# Patient Record
Sex: Male | Born: 1995 | Race: White | Hispanic: No | Marital: Single | Smoking: Current every day smoker
Health system: Southern US, Community
[De-identification: ages and names within clinical notes are randomized; demographics above are authoritative.]

## PROBLEM LIST (undated history)

## (undated) HISTORY — PX: OTHER SURGICAL HISTORY: SHX169

## (undated) HISTORY — PX: MYRINGOTOMY: SUR874

---
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
## 2015-06-25 ENCOUNTER — Emergency Department (HOSPITAL_COMMUNITY)
Admission: EM | Admit: 2015-06-25 | Discharge: 2015-06-25 | Disposition: A | Discharge status: Home / Self Care | Payer: Medicaid Other | Attending: Emergency Medicine | Admitting: Emergency Medicine

## 2015-06-25 ENCOUNTER — Emergency Department (HOSPITAL_COMMUNITY): Payer: Medicaid Other

## 2015-06-25 ENCOUNTER — Encounter (HOSPITAL_COMMUNITY): Payer: Self-pay | Admitting: Emergency Medicine

## 2015-06-25 DIAGNOSIS — Y939 Activity, unspecified: Secondary | ICD-10-CM | POA: Diagnosis not present

## 2015-06-25 DIAGNOSIS — F17210 Nicotine dependence, cigarettes, uncomplicated: Secondary | ICD-10-CM | POA: Diagnosis not present

## 2015-06-25 DIAGNOSIS — W108XXA Fall (on) (from) other stairs and steps, initial encounter: Secondary | ICD-10-CM | POA: Diagnosis not present

## 2015-06-25 DIAGNOSIS — M79641 Pain in right hand: Secondary | ICD-10-CM | POA: Diagnosis not present

## 2015-06-25 DIAGNOSIS — Y999 Unspecified external cause status: Secondary | ICD-10-CM | POA: Diagnosis not present

## 2015-06-25 DIAGNOSIS — Y929 Unspecified place or not applicable: Secondary | ICD-10-CM | POA: Diagnosis not present

## 2015-06-25 MED ORDER — IBUPROFEN 600 MG PO TABS: 600.0000 mg | ORAL_TABLET | Freq: Four times a day (QID) | ORAL | Status: DC | PRN

## 2015-06-25 NOTE — ED Provider Notes (Signed)
CSN: [id number]     Arrival date & time 06/25/15  1837 History  By signing my name below, I, Sean Hanson, attest that this documentation has been prepared under the direction and in the presence of [employer], PA-C. Electronically Signed: Evon Hanson, ED Scribe. 06/25/2015. 7:45 PM.      Chief Complaint  Patient presents with  . Hand Pain   The history is provided by the patient and medical records. No language interpreter was used.   HPI Comments: Sean Hanson is a 20 y.o. male who presents to the Emergency Department complaining of right hand pain onset 4 weeks prior. Pt doesn't report any associated symptoms. Pt states that his dog pulled him off his deck causing him to land on his right hand. He states that certain movements make the pain worse. Pt states that he has been taking ibuprofen with no relief. Pt denies numbness or tingling. Pt states that he is right hand dominant.  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    History reviewed. No pertinent past medical history. Past Surgical History  Procedure Laterality Date  . Myringotomy    . Tubes in ears     History reviewed. No pertinent family history. Social History  Substance Use Topics  . Smoking status: Current Every Day Smoker -- 0.25 packs/day    Types: Cigarettes  . Smokeless tobacco: None  . Alcohol Use: Yes     Comment: occ    Review of Systems  Constitutional: Negative for fever and chills.  Gastrointestinal: Negative for nausea and vomiting.  Musculoskeletal: Positive for joint swelling and arthralgias. Negative for back pain, neck pain and neck stiffness.  Skin: Negative for wound.  Neurological: Positive for weakness ( right hand grip strength). Negative for numbness.  Hematological: Does not bruise/bleed easily.  Psychiatric/Behavioral: The patient is not nervous/anxious.   All other systems reviewed and are negative.    Allergies  Review  of patient's allergies indicates no known allergies.  Home Medications   Prior to Admission medications   Medication Sig Start Date End Date Taking? Authorizing Provider  ibuprofen (ADVIL,MOTRIN) 600 MG tablet Take 1 tablet (600 mg total) by mouth every 6 (six) hours as needed for mild pain. 06/25/15   Kamilya Wakeman, PA-C   BP 155/93 mmHg  Pulse 70  Temp(Src) 98.9 F (37.2 C) (Oral)  Resp 18  Ht 6' (1.829 m)  Wt 61.236 kg  BMI 18.31 kg/m2  SpO2 100%   Physical Exam  Constitutional: He appears well-developed and well-nourished. No distress.  HENT:  Head: Normocephalic and atraumatic.  Eyes: Conjunctivae are normal.  Neck: Normal range of motion.  Cardiovascular: Normal rate, regular rhythm and intact distal pulses.   Capillary refill < 3 sec  Pulmonary/Chest: Effort normal and breath sounds normal.  Musculoskeletal: He exhibits tenderness. He exhibits no edema.  Right hand: FROM of all fingers, no swelling or ecchymosis to the hand; no TTP of the fingers or metacarpals; no deformity; sensation intact to dull and sharp in the entire hand; 5/5 grip strength and flexion/extension of the fingers  FROM of right wrist and right elbow with 5/5 strength in both with flexion and extension  Neurological: He is alert. Coordination normal.  Skin: Skin is warm and dry. He is not diaphoretic.  No tenting of the skin  Psychiatric: He has a normal mood and affect.  Nursing note and vitals reviewed.   ED Course  Procedures (including critical care  time) DIAGNOSTIC STUDIES: Oxygen Saturation is 100% on RA, normal by my interpretation.    COORDINATION OF CARE: 7:44 PM-Discussed treatment plan with pt at bedside and pt agreed to plan.     Imaging Review Dg Hand Complete Right  06/25/2015  CLINICAL DATA:  20 year old male with right hand pain. EXAM: RIGHT HAND - COMPLETE 3+ VIEW COMPARISON:  Radiographs dated 07/03/2011 FINDINGS: There is no evidence of fracture or dislocation. There is  no evidence of arthropathy or other focal bone abnormality. Soft tissues are unremarkable. IMPRESSION: Negative. Electronically Signed   By: Elgie CollardArash  Radparvar M.D.   On: 06/25/2015 19:18   I have personally reviewed and evaluated these images as part of my medical decision-making.    MDM   Final diagnoses:  Right hand pain    Marijo ConceptionBrandon A Hanson presents with 4 weeks of hand pain after a fall.  Patient X-Ray negative for obvious fracture or dislocation. Normal neurologic exam.  Pt c/o weakness, but has 5/5 strength in the hand. Pt advised to follow up with orthopedics if symptoms persist. Patient given brace while in ED, conservative therapy recommended and discussed. Patient will be dc home & is agreeable with above plan.  I personally performed the services described in this documentation, which was scribed in my presence. The recorded information has been reviewed and is accurate.   Dierdre ForthHannah Jamice Carreno, PA-C 06/25/15 2014  Marily MemosJason Mesner, MD 06/25/15 2253

## 2015-06-25 NOTE — Discharge Instructions (Signed)
1. Medications: alternate ibuprofen and tylenol for pain control, usual home medications 2. Treatment: rest, ice, elevate and use brace, drink plenty of fluids, gentle stretching 3. Follow Up: Please followup with orthopedics as directed or your PCP in 1 week if no improvement for discussion of your diagnoses and further evaluation after today's visit; if you do not have a primary care doctor use the resource guide provided to find one; Please return to the ER for worsening symptoms or other concerns    Musculoskeletal Pain Musculoskeletal pain is muscle and boney aches and pains. These pains can occur in any part of the body. Your caregiver may treat you without knowing the cause of the pain. They may treat you if blood or urine tests, X-rays, and other tests were normal.  CAUSES There is often not a definite cause or reason for these pains. These pains may be caused by a type of germ (virus). The discomfort may also come from overuse. Overuse includes working out too hard when your body is not fit. Boney aches also come from weather changes. Bone is sensitive to atmospheric pressure changes. HOME CARE INSTRUCTIONS   Ask when your test results will be ready. Make sure you get your test results.  Only take over-the-counter or prescription medicines for pain, discomfort, or fever as directed by your caregiver. If you were given medications for your condition, do not drive, operate machinery or power tools, or sign legal documents for 24 hours. Do not drink alcohol. Do not take sleeping pills or other medications that may interfere with treatment.  Continue all activities unless the activities cause more pain. When the pain lessens, slowly resume normal activities. Gradually increase the intensity and duration of the activities or exercise.  During periods of severe pain, bed rest may be helpful. Lay or sit in any position that is comfortable.  Putting ice on the injured area.  Put ice in a  bag.  Place a towel between your skin and the bag.  Leave the ice on for 15 to 20 minutes, 3 to 4 times a day.  Follow up with your caregiver for continued problems and no reason can be found for the pain. If the pain becomes worse or does not go away, it may be necessary to repeat tests or do additional testing. Your caregiver may need to look further for a possible cause. SEEK IMMEDIATE MEDICAL CARE IF:  You have pain that is getting worse and is not relieved by medications.  You develop chest pain that is associated with shortness or breath, sweating, feeling sick to your stomach (nauseous), or throw up (vomit).  Your pain becomes localized to the abdomen.  You develop any new symptoms that seem different or that concern you. MAKE SURE YOU:   Understand these instructions.  Will watch your condition.  Will get help right away if you are not doing well or get worse.   This information is not intended to replace advice given to you by your health care provider. Make sure you discuss any questions you have with your health care provider.   Document Released: 01/07/2005 Document Revised: 04/01/2011 Document Reviewed: 09/11/2012 Elsevier Interactive Patient Education [employer]2016 [employer]vier Inc.

## 2015-06-25 NOTE — ED Notes (Signed)
Pt states his dog pulled him off the steps about 4 weeks ago causing him to fall on his right hand.  States it continues to hurt.

## 2016-10-18 ENCOUNTER — Encounter (HOSPITAL_COMMUNITY): Payer: Self-pay | Admitting: *Deleted

## 2016-10-18 ENCOUNTER — Emergency Department (HOSPITAL_COMMUNITY)
Admission: EM | Admit: 2016-10-18 | Discharge: 2016-10-18 | Disposition: A | Discharge status: Home / Self Care | Payer: Self-pay | Attending: Emergency Medicine | Admitting: Emergency Medicine

## 2016-10-18 ENCOUNTER — Emergency Department (HOSPITAL_COMMUNITY): Payer: Self-pay

## 2016-10-18 DIAGNOSIS — F17210 Nicotine dependence, cigarettes, uncomplicated: Secondary | ICD-10-CM | POA: Insufficient documentation

## 2016-10-18 DIAGNOSIS — Y9389 Activity, other specified: Secondary | ICD-10-CM | POA: Insufficient documentation

## 2016-10-18 DIAGNOSIS — Y929 Unspecified place or not applicable: Secondary | ICD-10-CM | POA: Insufficient documentation

## 2016-10-18 DIAGNOSIS — R52 Pain, unspecified: Secondary | ICD-10-CM

## 2016-10-18 DIAGNOSIS — Y990 Civilian activity done for income or pay: Secondary | ICD-10-CM | POA: Insufficient documentation

## 2016-10-18 DIAGNOSIS — W228XXA Striking against or struck by other objects, initial encounter: Secondary | ICD-10-CM | POA: Insufficient documentation

## 2016-10-18 DIAGNOSIS — S43401A Unspecified sprain of right shoulder joint, initial encounter: Secondary | ICD-10-CM

## 2016-10-18 MED ORDER — CYCLOBENZAPRINE HCL 5 MG PO TABS: 5.0000 mg | ORAL_TABLET | Freq: Three times a day (TID) | ORAL | 0 refills | Status: DC | PRN

## 2016-10-18 MED ORDER — METHOCARBAMOL 500 MG PO TABS
500.0000 mg | ORAL_TABLET | Freq: Once | ORAL | Status: AC
  Administered 2016-10-18: 500 mg via ORAL
  Filled 2016-10-18: qty 1

## 2016-10-18 MED ORDER — IBUPROFEN 800 MG PO TABS: 800.0000 mg | ORAL_TABLET | Freq: Three times a day (TID) | ORAL | 0 refills | Status: DC

## 2016-10-18 MED ORDER — IBUPROFEN 800 MG PO TABS
800.0000 mg | ORAL_TABLET | Freq: Once | ORAL | Status: AC
  Administered 2016-10-18: 800 mg via ORAL
  Filled 2016-10-18: qty 1

## 2016-10-18 NOTE — Discharge Instructions (Signed)
Rest your shoulder as much as possible by using the sling.  Use the medicines prescribed for pain and suspected muscle spasm.  Use a heating pad 20 minutes 3 times daily. Call Dr Romeo Apple for further testing if your pain is not improving with this treatment.  Your xrays are negative for bony injuries.

## 2016-10-18 NOTE — ED Triage Notes (Signed)
Pt with right shoulder pain for 3 weeks, has progressively gotten worse.  Pt installs windows and about 3 weeks ago was carrying a "twin" window and hit door frame, window dropped and pt caught it.

## 2016-10-18 NOTE — ED Notes (Signed)
Pt states understanding of care given and follow up instructions.  Pt a.o ambulated from ED

## 2016-10-19 NOTE — ED Provider Notes (Signed)
[facility]-EMERGENCY DEPT Provider Note   CSN: [id number] Arrival date & time: 10/18/16  2156     History   Chief Complaint Chief Complaint  Patient presents with  . Shoulder Pain    HPI Sean Hanson is a 21 y.o. male.  The history is provided by the patient and the spouse.  Shoulder Pain   This is a new problem. Episode onset: 3 weeks ago. The problem occurs constantly. The problem has been gradually worsening. The pain is present in the right shoulder. The quality of the pain is described as aching and constant. The pain is at a severity of 6/10. The pain is moderate. Associated symptoms include limited range of motion. Pertinent negatives include no numbness. The symptoms are aggravated by activity. He has tried rest, heat and cold for the symptoms. The treatment provided no relief. There has been a history of trauma (He was directly struck by the edge of a window he was carrying (is a window installer) .).    History reviewed. No pertinent past medical history.  Patient Active Problem List   Diagnosis Date Noted  . FINGER SPRAIN 03/30/2009    Past Surgical History:  Procedure Laterality Date  . MYRINGOTOMY    . tubes in ears         Home Medications    Prior to Admission medications   Medication Sig Start Date End Date Taking? Authorizing Provider  cyclobenzaprine (FLEXERIL) 5 MG tablet Take 1 tablet (5 mg total) by mouth 3 (three) times daily as needed for muscle spasms. 10/18/16   Burgess Amor, PA-C  ibuprofen (ADVIL,MOTRIN) 800 MG tablet Take 1 tablet (800 mg total) by mouth 3 (three) times daily. 10/18/16   Burgess Amor, PA-C    Family History History reviewed. No pertinent family history.  Social History Social History  Substance Use Topics  . Smoking status: Current Every Day Smoker    Packs/day: 0.25    Types: Cigarettes  . Smokeless tobacco: Never Used  . Alcohol use Yes     Comment: occ     Allergies   Patient has no known allergies.   Review of  Systems Review of Systems  Constitutional: Negative for fever.  Musculoskeletal: Positive for arthralgias. Negative for joint swelling and myalgias.  Neurological: Negative for weakness and numbness.     Physical Exam Updated Vital Signs BP 122/71   Pulse 82   Temp 98.6 F (37 C) (Oral)   Resp 18   Ht 6' (1.829 m)   Wt 63.5 kg (140 lb)   SpO2 98%   BMI 18.99 kg/m   Physical Exam  Constitutional: He appears well-developed and well-nourished.  HENT:  Head: Atraumatic.  Neck: Normal range of motion.  Cardiovascular:  Pulses equal bilaterally  Musculoskeletal: He exhibits tenderness. He exhibits no edema or deformity.       Right shoulder: He exhibits decreased range of motion and spasm. He exhibits no effusion, no crepitus and no deformity.  ttp right upper posterior shoulder . No palpable deformity, no edema.    Neurological: He is alert. He has normal strength. He displays normal reflexes. No sensory deficit.  Equal grip strength.  Skin: Skin is warm and dry.  Psychiatric: He has a normal mood and affect.  Vitals reviewed.    ED Treatments / Results  Labs (all labs ordered are listed, but only abnormal results are displayed) Labs Reviewed - No data to display  EKG  EKG Interpretation None  Radiology Dg Shoulder Right  Result Date: 10/18/2016 CLINICAL DATA:  Right shoulder pain x3 weeks. EXAM: RIGHT SHOULDER - 2+ VIEW COMPARISON:  None. FINDINGS: There is no evidence of fracture or dislocation. There is no evidence of arthropathy or other focal bone abnormality. Soft tissues are unremarkable. IMPRESSION: No acute fracture or malalignment of the right shoulder. Intact AC joint. Electronically Signed   By: Tollie Eth M.D.   On: 10/18/2016 22:46    Procedures Procedures (including critical care time)  Medications Ordered in ED Medications  ibuprofen (ADVIL,MOTRIN) tablet 800 mg (800 mg Oral Given 10/18/16 2329)  methocarbamol (ROBAXIN) tablet 500 mg (500  mg Oral Given 10/18/16 2329)     Initial Impression / Assessment and Plan / ED Course  I have reviewed the triage vital signs and the nursing notes.  Pertinent labs & imaging results that were available during my care of the patient were reviewed by me and considered in my medical decision making (see chart for details).     Imaging negative, suspect right shoulder sprain, discussed need for rest, heat, ibuprofen.  Flexeril also prescribed for muscle spasm component.  Sling provided.  Referral to ortho for further eval if sx persist.  Final Clinical Impressions(s) / ED Diagnoses   Final diagnoses:  Pain  Sprain of right shoulder, unspecified shoulder sprain type, initial encounter    New Prescriptions Discharge Medication List as of 10/18/2016 11:24 PM    START taking these medications   Details  cyclobenzaprine (FLEXERIL) 5 MG tablet Take 1 tablet (5 mg total) by mouth 3 (three) times daily as needed for muscle spasms., Starting Fri 10/18/2016, Print         Burgess Amor, PA-C 10/19/16 1610    Blane Ohara, MD 10/20/16 [id number]

## 2016-12-19 ENCOUNTER — Emergency Department (HOSPITAL_COMMUNITY): Payer: Self-pay

## 2016-12-19 ENCOUNTER — Encounter (HOSPITAL_COMMUNITY): Payer: Self-pay | Admitting: Emergency Medicine

## 2016-12-19 ENCOUNTER — Emergency Department (HOSPITAL_COMMUNITY)
Admission: EM | Admit: 2016-12-19 | Discharge: 2016-12-19 | Disposition: A | Discharge status: Home / Self Care | Payer: Self-pay | Attending: Emergency Medicine | Admitting: Emergency Medicine

## 2016-12-19 DIAGNOSIS — J029 Acute pharyngitis, unspecified: Secondary | ICD-10-CM | POA: Insufficient documentation

## 2016-12-19 DIAGNOSIS — R69 Illness, unspecified: Secondary | ICD-10-CM

## 2016-12-19 DIAGNOSIS — R05 Cough: Secondary | ICD-10-CM | POA: Insufficient documentation

## 2016-12-19 DIAGNOSIS — F17210 Nicotine dependence, cigarettes, uncomplicated: Secondary | ICD-10-CM | POA: Insufficient documentation

## 2016-12-19 DIAGNOSIS — R51 Headache: Secondary | ICD-10-CM | POA: Insufficient documentation

## 2016-12-19 DIAGNOSIS — J111 Influenza due to unidentified influenza virus with other respiratory manifestations: Secondary | ICD-10-CM

## 2016-12-19 LAB — RAPID STREP SCREEN (MED CTR MEBANE ONLY): STREPTOCOCCUS, GROUP A SCREEN (DIRECT): NEGATIVE

## 2016-12-19 MED ORDER — IBUPROFEN 800 MG PO TABS: 800.0000 mg | ORAL_TABLET | Freq: Three times a day (TID) | ORAL | 0 refills | Status: DC

## 2016-12-19 MED ORDER — MAGIC MOUTHWASH W/LIDOCAINE: 5.0000 mL | Freq: Three times a day (TID) | ORAL | 0 refills | Status: AC | PRN

## 2016-12-19 MED ORDER — IBUPROFEN 800 MG PO TABS
800.0000 mg | ORAL_TABLET | Freq: Once | ORAL | Status: AC
  Administered 2016-12-19: 800 mg via ORAL
  Filled 2016-12-19: qty 1

## 2016-12-19 MED ORDER — GUAIFENESIN-CODEINE 100-10 MG/5ML PO SYRP: 10.0000 mL | ORAL_SOLUTION | Freq: Three times a day (TID) | ORAL | 0 refills | Status: AC | PRN

## 2016-12-19 MED ORDER — BENZONATATE 100 MG PO CAPS
200.0000 mg | ORAL_CAPSULE | Freq: Once | ORAL | Status: AC
  Administered 2016-12-19: 200 mg via ORAL
  Filled 2016-12-19: qty 2

## 2016-12-19 NOTE — ED Provider Notes (Signed)
[facility] EMERGENCY DEPARTMENT Provider Note   CSN: [id number] Arrival date & time: 12/19/16  [id number]     History   Chief Complaint Chief Complaint  Patient presents with  . Cough    HPI Sean Hanson is a 21 y.o. male.  HPI   Sean Hanson is a 21 y.o. male who presents to the Emergency Department complaining of productive cough, generalized body aches, sore throat, and frontal headache.  Symptoms began 3 days ago.  States the cough is intermittently been productive.  He is taking over-the-counter cough and cold medication without relief.  He describes the headache as a throbbing sensation to his forehead and bilateral temples that is worse with persistent cough.  He also reports recent sick contacts.  He denies neck pain or stiffness, shortness of breath, chest pain, known fever, abdominal pain, visual changes and dizziness.   History reviewed. No pertinent past medical history.  Patient Active Problem List   Diagnosis Date Noted  . FINGER SPRAIN 03/30/2009    Past Surgical History:  Procedure Laterality Date  . MYRINGOTOMY    . tubes in ears         Home Medications    Prior to Admission medications   Medication Sig Start Date End Date Taking? Authorizing Provider  cyclobenzaprine (FLEXERIL) 5 MG tablet Take 1 tablet (5 mg total) by mouth 3 (three) times daily as needed for muscle spasms. 10/18/16   Burgess AmorIdol, Julie, PA-C  ibuprofen (ADVIL,MOTRIN) 800 MG tablet Take 1 tablet (800 mg total) by mouth 3 (three) times daily. 10/18/16   Burgess AmorIdol, Julie, PA-C    Family History History reviewed. No pertinent family history.  Social History Social History   Tobacco Use  . Smoking status: Current Every Day Smoker    Packs/day: 1.00    Types: Cigarettes  . Smokeless tobacco: Never Used  Substance Use Topics  . Alcohol use: Yes    Comment: occ  . Drug use: No     Allergies   Patient has no known allergies.   Review of Systems Review of Systems    Constitutional: Positive for appetite change. Negative for chills and fever.  HENT: Positive for congestion and sore throat. Negative for trouble swallowing.   Respiratory: Positive for cough and chest tightness. Negative for shortness of breath and wheezing.   Cardiovascular: Negative for chest pain.  Gastrointestinal: Negative for abdominal pain, nausea and vomiting.  Genitourinary: Negative for dysuria.  Musculoskeletal: Positive for myalgias. Negative for arthralgias, neck pain and neck stiffness.  Skin: Negative for rash.  Neurological: Positive for headaches. Negative for dizziness, syncope, weakness and numbness.  Hematological: Negative for adenopathy.  All other systems reviewed and are negative.    Physical Exam Updated Vital Signs BP (!) 137/92 (BP Location: Left Arm)   Pulse (!) 109   Temp 99.3 F (37.4 C) (Oral)   Resp 16   Ht 6' (1.829 m)   Wt 65.8 kg (145 lb)   SpO2 99%   BMI 19.67 kg/m   Physical Exam  Constitutional: He is oriented to person, place, and time. He appears well-developed and well-nourished. No distress.  HENT:  Head: Normocephalic and atraumatic.  Right Ear: Tympanic membrane and ear canal normal.  Left Ear: Tympanic membrane and ear canal normal.  Nose: Mucosal edema and rhinorrhea present.  Mouth/Throat: Uvula is midline, oropharynx is clear and moist and mucous membranes are normal. No trismus in the jaw. No uvula swelling. No oropharyngeal exudate, posterior oropharyngeal edema or  tonsillar abscesses.  Mild erythema of the oropharynx.  1+ edema of the bilateral tonsils without exudate.  Uvula is midline and nonedematous  Eyes: Conjunctivae and EOM are normal. Pupils are equal, round, and reactive to light.  Neck: Normal range of motion, full passive range of motion without pain and phonation normal. Neck supple. No Kernig's sign noted.  Cardiovascular: Normal rate, regular rhythm, normal heart sounds and intact distal pulses.  No murmur  heard. Pulmonary/Chest: Effort normal and breath sounds normal. No stridor. No respiratory distress. He has no wheezes. He has no rales. He exhibits no tenderness.  Abdominal: Soft. He exhibits no distension. There is no tenderness. There is no rebound and no guarding.  Musculoskeletal: He exhibits no edema.  Lymphadenopathy:    He has no cervical adenopathy.  Neurological: He is alert and oriented to person, place, and time. No sensory deficit. He exhibits normal muscle tone. Coordination normal.  Skin: Skin is warm and dry. Capillary refill takes less than 2 seconds.  Nursing note and vitals reviewed.    ED Treatments / Results  Labs (all labs ordered are listed, but only abnormal results are displayed) Labs Reviewed  RAPID STREP SCREEN (NOT AT [facility])  CULTURE, GROUP A STREP [facility])    EKG  EKG Interpretation None       Radiology Dg Chest 2 View  Result Date: 12/19/2016 CLINICAL DATA:  21 year old male with history of cough and chest tightness since 12/16/2016. EXAM: CHEST  2 VIEW COMPARISON:  Chest x-ray 12/21/2014. FINDINGS: Lung volumes are normal. No consolidative airspace disease. No pleural effusions. No pneumothorax. No pulmonary nodule or mass noted. Pulmonary vasculature and the cardiomediastinal silhouette are within normal limits. IMPRESSION: 1.  No radiographic evidence of acute cardiopulmonary disease. Electronically Signed   By: Trudie Reedaniel  Entrikin M.D.   On: 12/19/2016 09:13    Procedures Procedures (including critical care time)  Medications Ordered in ED Medications  benzonatate (TESSALON) capsule 200 mg (200 mg Oral Given 12/19/16 0901)  ibuprofen (ADVIL,MOTRIN) tablet 800 mg (800 mg Oral Given 12/19/16 0901)     Initial Impression / Assessment and Plan / ED Course  I have reviewed the triage vital signs and the nursing notes.  Pertinent labs & imaging results that were available during my care of the patient were reviewed by me and considered in my  medical decision making (see chart for details).     Patient is feeling better after oral fluids.  Vital signs improved.  Symptoms are likely viral.  No meningeal signs or focal neurological deficits.  Headache improved after ibuprofen.  He appears stable for discharge, return precautions discussed.  Final Clinical Impressions(s) / ED Diagnoses   Final diagnoses:  Influenza-like illness    ED Discharge Orders    None       Pauline Ausriplett, Haillie Radu, PA-C 12/19/16 Johnna Acosta1938    Knapp, Jon, MD 12/21/16 [phone]

## 2016-12-19 NOTE — Discharge Instructions (Signed)
Drink plenty of fluids.  Tylenol every 4 hours as needed for fever.  Follow-up with your primary doctor or return to ER for any worsening symptoms.

## 2016-12-19 NOTE — ED Triage Notes (Signed)
Pt reports productive cough with sore throat and headache since Monday.

## 2016-12-21 LAB — CULTURE, GROUP A STREP (THRC)

## 2018-05-25 ENCOUNTER — Emergency Department (HOSPITAL_COMMUNITY): Payer: Self-pay

## 2018-05-25 ENCOUNTER — Emergency Department (HOSPITAL_COMMUNITY)
Admission: EM | Admit: 2018-05-25 | Discharge: 2018-05-25 | Disposition: A | Discharge status: Home / Self Care | Payer: Self-pay | Attending: Emergency Medicine | Admitting: Emergency Medicine

## 2018-05-25 ENCOUNTER — Other Ambulatory Visit: Payer: Self-pay

## 2018-05-25 ENCOUNTER — Encounter (HOSPITAL_COMMUNITY): Payer: Self-pay

## 2018-05-25 DIAGNOSIS — Y999 Unspecified external cause status: Secondary | ICD-10-CM | POA: Insufficient documentation

## 2018-05-25 DIAGNOSIS — F17210 Nicotine dependence, cigarettes, uncomplicated: Secondary | ICD-10-CM | POA: Insufficient documentation

## 2018-05-25 DIAGNOSIS — Y9389 Activity, other specified: Secondary | ICD-10-CM | POA: Insufficient documentation

## 2018-05-25 DIAGNOSIS — Z23 Encounter for immunization: Secondary | ICD-10-CM | POA: Insufficient documentation

## 2018-05-25 DIAGNOSIS — Y929 Unspecified place or not applicable: Secondary | ICD-10-CM | POA: Insufficient documentation

## 2018-05-25 DIAGNOSIS — S62357A Nondisplaced fracture of shaft of fifth metacarpal bone, left hand, initial encounter for closed fracture: Secondary | ICD-10-CM | POA: Insufficient documentation

## 2018-05-25 DIAGNOSIS — Z79899 Other long term (current) drug therapy: Secondary | ICD-10-CM | POA: Insufficient documentation

## 2018-05-25 DIAGNOSIS — W2209XA Striking against other stationary object, initial encounter: Secondary | ICD-10-CM | POA: Insufficient documentation

## 2018-05-25 MED ORDER — IBUPROFEN 600 MG PO TABS: 600.0000 mg | ORAL_TABLET | Freq: Four times a day (QID) | ORAL | 0 refills | Status: DC | PRN

## 2018-05-25 MED ORDER — BACITRACIN-NEOMYCIN-POLYMYXIN 400-5-5000 EX OINT
TOPICAL_OINTMENT | Freq: Once | CUTANEOUS | Status: AC
Start: 2018-05-25 — End: 2018-05-25
  Administered 2018-05-25: 18:00:00 via TOPICAL
  Filled 2018-05-25: qty 1

## 2018-05-25 MED ORDER — TETANUS-DIPHTH-ACELL PERTUSSIS 5-2.5-18.5 LF-MCG/0.5 IM SUSP
0.5000 mL | Freq: Once | INTRAMUSCULAR | Status: AC
  Administered 2018-05-25: 0.5 mL via INTRAMUSCULAR
  Filled 2018-05-25: qty 0.5

## 2018-05-25 NOTE — ED Triage Notes (Signed)
Pt reports punching wall yesterday. Left hand swollen and painful. Also noted to have abraised areas to right fingers and knuckles

## 2018-05-25 NOTE — Discharge Instructions (Addendum)
Keep your splint clean and dry.  Elevation and ice will continue to help with pain and swelling.  I recommend ibuprofen if needed for pain relief.
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
## 2018-07-26 ENCOUNTER — Ambulatory Visit (HOSPITAL_COMMUNITY)
Admission: RE | Admit: 2018-07-26 | Discharge: 2018-07-26 | Disposition: A | Discharge status: Home / Self Care | Payer: Self-pay | Attending: Psychiatry | Admitting: Psychiatry

## 2018-07-26 DIAGNOSIS — F411 Generalized anxiety disorder: Secondary | ICD-10-CM | POA: Insufficient documentation

## 2018-07-26 DIAGNOSIS — F331 Major depressive disorder, recurrent, moderate: Secondary | ICD-10-CM | POA: Insufficient documentation

## 2018-07-26 DIAGNOSIS — F17210 Nicotine dependence, cigarettes, uncomplicated: Secondary | ICD-10-CM | POA: Insufficient documentation
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
## 2018-07-26 NOTE — H&P (Signed)
Behavioral Health Medical Screening Exam  Sean Hanson is an 23 y.o. male that presents to [facility] unaccompanied.  He is AOx4 and engaging on approach.  He states that he has been feeling depressed and anxious. Pt states that he has never had a formal diagnosis but has identified with these feelings in the past.  He denies alcohol or drug use. Pt denies SI or HI.  States that he feels safe to go home and to call the crisis hotline or come back in if he started to have self injurious thoughts.  Pt was advised to follow up with outpatient care.  He was given resources and was discharged home.    Patient does not meet criteria for psychiatric inpatient care.  Total Time spent with patient: 30 minutes  Psychiatric Specialty Exam: Physical Exam  Constitutional: He is oriented to person, place, and time. He appears well-developed and well-nourished. He is cooperative.  HENT:  Head: Normocephalic.  Eyes: Pupils are equal, round, and reactive to light.  Respiratory: Effort normal.  Musculoskeletal: Normal range of motion.  Neurological: He is alert and oriented to person, place, and time.    Review of Systems  Psychiatric/Behavioral: Positive for depression. Negative for hallucinations, memory loss, substance abuse and suicidal ideas. The patient is nervous/anxious and has insomnia.   All other systems reviewed and are negative.   Blood pressure (!) 127/91, pulse 85, temperature 98.7 F (37.1 C), temperature source Oral, resp. rate 16.There is no height or weight on file to calculate BMI.  General Appearance: Casual  Eye Contact:  Good  Speech:  Normal Rate  Volume:  Normal  Mood:  Depressed  Affect:  Congruent  Thought Process:  Coherent  Orientation:  Full (Time, Place, and Person)  Thought Content:  WDL  Suicidal Thoughts:  No  Homicidal Thoughts:  No  Memory:  Immediate;   Good  Judgement:  Good  Insight:  Good  Psychomotor Activity:  Normal  Concentration: Concentration: Good   Recall:  Good  Fund of Knowledge:Good  Language: Good  Akathisia:  NA  Handed:  Right  AIMS (if indicated):     Assets:  Desire for Improvement  Sleep:       Musculoskeletal: Strength & Muscle Tone: within normal limits Gait & Station: normal Patient leans: N/A  Blood pressure (!) 127/91, pulse 85, temperature 98.7 F (37.1 C), temperature source Oral, resp. rate 16.  Recommendations:  Based on my evaluation the patient does not appear to have an emergency medical condition.  Deloria Lair, NP 07/26/2018, 8:59 PM

## 2018-11-10 ENCOUNTER — Ambulatory Visit: Payer: Self-pay | Admitting: General Surgery

## 2018-12-08 ENCOUNTER — Ambulatory Visit (INDEPENDENT_AMBULATORY_CARE_PROVIDER_SITE_OTHER): Payer: Self-pay | Admitting: General Surgery

## 2018-12-08 ENCOUNTER — Other Ambulatory Visit: Payer: Self-pay

## 2018-12-08 ENCOUNTER — Encounter: Payer: Self-pay | Admitting: General Surgery

## 2018-12-08 VITALS — BP 134/87 | HR 82 | Temp 98.2°F | Resp 16 | Ht 72.0 in | Wt 174.0 lb

## 2018-12-08 DIAGNOSIS — K4090 Unilateral inguinal hernia, without obstruction or gangrene, not specified as recurrent: Secondary | ICD-10-CM

## 2018-12-08 NOTE — Progress Notes (Signed)
Sean Hanson; [id number]; 04-15-1995   HPI Patient is a 23 year old white male who referred himself to my care for evaluation and treatment of a right inguinal hernia.  He states he has had a hernia present on the right side for many years.  Is recently becoming more uncomfortable and enlarging in size.  It is made worse with straining.  He currently has 0 out of 10 abdominal pain. History reviewed. No pertinent past medical history.  Past Surgical History:  Procedure Laterality Date  . MYRINGOTOMY    . tubes in ears      History reviewed. No pertinent family history.  Current Outpatient Medications on File Prior to Visit  Medication Sig Dispense Refill  . aspirin-acetaminophen-caffeine (EXCEDRIN MIGRAINE) 250-250-65 MG tablet Take 2 tablets by mouth every 6 (six) hours as needed for headache.    . Phenylephrine-DM-GG-APAP (TYLENOL COLD/FLU SEVERE) 5-10-200-325 MG TABS Take 2 tablets by mouth daily as needed (cold).    Marland Kitchen guaiFENesin-codeine (ROBITUSSIN AC) 100-10 MG/5ML syrup Take 10 mLs by mouth 3 (three) times daily as needed. (Patient not taking: Reported on 12/08/2018) 120 mL 0  . ibuprofen (ADVIL) 600 MG tablet Take 1 tablet (600 mg total) by mouth every 6 (six) hours as needed. (Patient not taking: Reported on 12/08/2018) 30 tablet 0  . magic mouthwash w/lidocaine SOLN Take 5 mLs by mouth 3 (three) times daily as needed for mouth pain. Swish and spit, do not swallow (Patient not taking: Reported on 12/08/2018) 100 mL 0   No current facility-administered medications on file prior to visit.     No Known Allergies  Social History   Substance and Sexual Activity  Alcohol Use Yes   Comment: occ    Social History   Tobacco Use  Smoking Status Current Every Day Smoker  . Packs/day: 1.00  . Types: Cigarettes  Smokeless Tobacco Never Used    Review of Systems  Constitutional: Negative.   HENT: Negative.   Eyes: Negative.   Respiratory: Negative.   Cardiovascular:  Negative.   Gastrointestinal: Positive for heartburn.  Genitourinary: Positive for frequency.  Musculoskeletal: Positive for back pain and joint pain.  Skin: Negative.   Neurological: Negative.   Endo/Heme/Allergies: Negative.   Psychiatric/Behavioral: Negative.     Objective   Vitals:   12/08/18 1540  BP: 134/87  Pulse: 82  Resp: 16  Temp: 98.2 F (36.8 C)  SpO2: 95%    Physical Exam Vitals signs reviewed.  Constitutional:      Appearance: Normal appearance. He is normal weight. He is not ill-appearing.  HENT:     Head: Normocephalic and atraumatic.  Cardiovascular:     Rate and Rhythm: Normal rate and regular rhythm.     Heart sounds: Normal heart sounds. No murmur. No friction rub. No gallop.   Pulmonary:     Effort: Pulmonary effort is normal. No respiratory distress.     Breath sounds: Normal breath sounds. No stridor. No wheezing, rhonchi or rales.  Abdominal:     General: Abdomen is flat. There is no distension.     Palpations: Abdomen is soft. There is no mass.     Tenderness: There is no abdominal tenderness. There is no guarding or rebound.     Hernia: A hernia is present.     Comments: Reducible right inguinal hernia with possible extension into the right testicle.  Skin:    General: Skin is warm and dry.  Neurological:     Mental Status: He is alert and  oriented to person, place, and time.     Assessment  Right inguinal hernia Plan   Patient is scheduled for right inguinal herniorrhaphy with mesh on 12/16/2018.  The risks and benefits of the procedure including bleeding, infection, mesh use, and the possibly recurrence of the hernia were fully explained to the patient, who gave informed consent.  

## 2018-12-08 NOTE — H&P (Signed)
Sean Hanson; [id number]; 04-15-1995   HPI Patient is a 23 year old white male who referred himself to my care for evaluation and treatment of a right inguinal hernia.  He states he has had a hernia present on the right side for many years.  Is recently becoming more uncomfortable and enlarging in size.  It is made worse with straining.  He currently has 0 out of 10 abdominal pain. History reviewed. No pertinent past medical history.  Past Surgical History:  Procedure Laterality Date  . MYRINGOTOMY    . tubes in ears      History reviewed. No pertinent family history.  Current Outpatient Medications on File Prior to Visit  Medication Sig Dispense Refill  . aspirin-acetaminophen-caffeine (EXCEDRIN MIGRAINE) 250-250-65 MG tablet Take 2 tablets by mouth every 6 (six) hours as needed for headache.    . Phenylephrine-DM-GG-APAP (TYLENOL COLD/FLU SEVERE) 5-10-200-325 MG TABS Take 2 tablets by mouth daily as needed (cold).    Marland Kitchen guaiFENesin-codeine (ROBITUSSIN AC) 100-10 MG/5ML syrup Take 10 mLs by mouth 3 (three) times daily as needed. (Patient not taking: Reported on 12/08/2018) 120 mL 0  . ibuprofen (ADVIL) 600 MG tablet Take 1 tablet (600 mg total) by mouth every 6 (six) hours as needed. (Patient not taking: Reported on 12/08/2018) 30 tablet 0  . magic mouthwash w/lidocaine SOLN Take 5 mLs by mouth 3 (three) times daily as needed for mouth pain. Swish and spit, do not swallow (Patient not taking: Reported on 12/08/2018) 100 mL 0   No current facility-administered medications on file prior to visit.     No Known Allergies  Social History   Substance and Sexual Activity  Alcohol Use Yes   Comment: occ    Social History   Tobacco Use  Smoking Status Current Every Day Smoker  . Packs/day: 1.00  . Types: Cigarettes  Smokeless Tobacco Never Used    Review of Systems  Constitutional: Negative.   HENT: Negative.   Eyes: Negative.   Respiratory: Negative.   Cardiovascular:  Negative.   Gastrointestinal: Positive for heartburn.  Genitourinary: Positive for frequency.  Musculoskeletal: Positive for back pain and joint pain.  Skin: Negative.   Neurological: Negative.   Endo/Heme/Allergies: Negative.   Psychiatric/Behavioral: Negative.     Objective   Vitals:   12/08/18 1540  BP: 134/87  Pulse: 82  Resp: 16  Temp: 98.2 F (36.8 C)  SpO2: 95%    Physical Exam Vitals signs reviewed.  Constitutional:      Appearance: Normal appearance. He is normal weight. He is not ill-appearing.  HENT:     Head: Normocephalic and atraumatic.  Cardiovascular:     Rate and Rhythm: Normal rate and regular rhythm.     Heart sounds: Normal heart sounds. No murmur. No friction rub. No gallop.   Pulmonary:     Effort: Pulmonary effort is normal. No respiratory distress.     Breath sounds: Normal breath sounds. No stridor. No wheezing, rhonchi or rales.  Abdominal:     General: Abdomen is flat. There is no distension.     Palpations: Abdomen is soft. There is no mass.     Tenderness: There is no abdominal tenderness. There is no guarding or rebound.     Hernia: A hernia is present.     Comments: Reducible right inguinal hernia with possible extension into the right testicle.  Skin:    General: Skin is warm and dry.  Neurological:     Mental Status: He is alert and  oriented to person, place, and time.     Assessment  Right inguinal hernia Plan   Patient is scheduled for right inguinal herniorrhaphy with mesh on 12/16/2018.  The risks and benefits of the procedure including bleeding, infection, mesh use, and the possibly recurrence of the hernia were fully explained to the patient, who gave informed consent.

## 2018-12-08 NOTE — Patient Instructions (Signed)
Inguinal Hernia, Adult °An inguinal hernia develops when fat or the intestines push through a weak spot in a muscle where your leg meets your lower abdomen (groin). This creates a bulge. This kind of hernia could also be: °· In your scrotum, if you are male. °· In folds of skin around your vagina, if you are male. °There are three types of inguinal hernias: °· Hernias that can be pushed back into the abdomen (are reducible). This type rarely causes pain. °· Hernias that are not reducible (are incarcerated). °· Hernias that are not reducible and lose their blood supply (are strangulated). This type of hernia requires emergency surgery. °What are the causes? °This condition is caused by having a weak spot in the muscles or tissues in the groin. This weak spot develops over time. The hernia may poke through the weak spot when you suddenly strain your lower abdominal muscles, such as when you: °· Lift a heavy object. °· Strain to have a bowel movement. Constipation can lead to straining. °· Cough. °What increases the risk? °This condition is more likely to develop in: °· Men. °· Pregnant women. °· People who: °? Are overweight. °? Work in jobs that require long periods of standing or heavy lifting. °? Have had an inguinal hernia before. °? Smoke or have lung disease. These factors can lead to long-lasting (chronic) coughing. °What are the signs or symptoms? °Symptoms may depend on the size of the hernia. Often, a small inguinal hernia has no symptoms. Symptoms of a larger hernia may include: °· A lump in the groin area. This is easier to see when standing. It might not be visible when lying down. °· Pain or burning in the groin. This may get worse when lifting, straining, or coughing. °· A dull ache or a feeling of pressure in the groin. °· In men, an unusual lump in the scrotum. °Symptoms of a strangulated inguinal hernia may include: °· A bulge in your groin that is very painful and tender to the touch. °· A bulge  that turns red or purple. °· Fever, nausea, and vomiting. °· Inability to have a bowel movement or to pass gas. °How is this diagnosed? °This condition is diagnosed based on your symptoms, your medical history, and a physical exam. Your health care provider may feel your groin area and ask you to cough. °How is this treated? °Treatment depends on the size of your hernia and whether you have symptoms. If you do not have symptoms, your health care provider may have you watch your hernia carefully and have you come in for follow-up visits. If your hernia is large or if you have symptoms, you may need surgery to repair the hernia. °Follow these instructions at home: °Lifestyle °· Avoid lifting heavy objects. °· Avoid standing for long periods of time. °· Do not use any products that contain nicotine or tobacco, such as cigarettes and e-cigarettes. If you need help quitting, ask your health care provider. °· Maintain a healthy weight. °Preventing constipation °· Take actions to prevent constipation. Constipation leads to straining with bowel movements, which can make a hernia worse or cause a hernia repair to break down. Your health care provider may recommend that you: °? Drink enough fluid to keep your urine pale yellow. °? Eat foods that are high in fiber, such as fresh fruits and vegetables, whole grains, and beans. °? Limit foods that are high in fat and processed sugars, such as fried or sweet foods. °? Take an over-the-counter   or prescription medicine for constipation. °General instructions °· You may try to push the hernia back in place by very gently pressing on it while lying down. Do not try to force the bulge back in if it will not push in easily. °· Watch your hernia for any changes in shape, size, or color. Get help right away if you notice any changes. °· Take over-the-counter and prescription medicines only as told by your health care provider. °· Keep all follow-up visits as told by your health care  provider. This is important. °Contact a health care provider if: °· You have a fever. °· You develop new symptoms. °· Your symptoms get worse. °Get help right away if: °· You have pain in your groin that suddenly gets worse. °· You have a bulge in your groin that: °? Suddenly gets bigger and does not get smaller. °? Becomes red or purple or painful to the touch. °· You are a man and you have a sudden pain in your scrotum, or the size of your scrotum suddenly changes. °· You cannot push the hernia back in place by very gently pressing on it when you are lying down. Do not try to force the bulge back in if it will not push in easily. °· You have nausea or vomiting that does not go away. °· You have a fast heartbeat. °· You cannot have a bowel movement or pass gas. °These symptoms may represent a serious problem that is an emergency. Do not wait to see if the symptoms will go away. Get medical help right away. Call your local emergency services (911 in the [place] °Summary °· An inguinal hernia develops when fat or the intestines push through a weak spot in a muscle where your leg meets your lower abdomen (groin). °· This condition is caused by having a weak spot in muscles or tissue in your groin. °· Symptoms may depend on the size of the hernia, and they may include pain or swelling in your groin. A small inguinal hernia often has no symptoms. °· Treatment may not be needed if you do not have symptoms. If you have symptoms or a large hernia, you may need surgery to repair the hernia. °· Avoid lifting heavy objects. Also avoid standing for long amounts of time. °This information is not intended to replace advice given to you by your health care provider. Make sure you discuss any questions you have with your health care provider. °Document Released: 05/26/2008 Document Revised: 02/08/2017 Document Reviewed: 10/09/2016 °Elsevier Patient Education © 2020 [employer] ° °

## 2018-12-14 ENCOUNTER — Encounter (HOSPITAL_COMMUNITY)
Admission: RE | Admit: 2018-12-14 | Discharge: 2018-12-14 | Disposition: A | Discharge status: Home / Self Care | Payer: Self-pay | Source: Ambulatory Visit | Attending: General Surgery | Admitting: General Surgery

## 2018-12-14 ENCOUNTER — Other Ambulatory Visit (HOSPITAL_COMMUNITY)
Admission: RE | Admit: 2018-12-14 | Discharge: 2018-12-14 | Disposition: A | Discharge status: Home / Self Care | Payer: Self-pay | Source: Ambulatory Visit | Attending: General Surgery | Admitting: General Surgery

## 2018-12-14 ENCOUNTER — Other Ambulatory Visit: Payer: Self-pay

## 2018-12-14 DIAGNOSIS — Z20828 Contact with and (suspected) exposure to other viral communicable diseases: Secondary | ICD-10-CM | POA: Insufficient documentation

## 2018-12-14 DIAGNOSIS — Z01812 Encounter for preprocedural laboratory examination: Secondary | ICD-10-CM | POA: Insufficient documentation

## 2018-12-14 LAB — SARS CORONAVIRUS 2 (TAT 6-24 HRS): SARS Coronavirus 2: NEGATIVE

## 2018-12-16 ENCOUNTER — Encounter (HOSPITAL_COMMUNITY)
Admission: RE | Disposition: A | Discharge status: Home / Self Care | Payer: Self-pay | Source: Ambulatory Visit | Attending: General Surgery

## 2018-12-16 ENCOUNTER — Ambulatory Visit (HOSPITAL_COMMUNITY): Payer: Self-pay | Admitting: Anesthesiology

## 2018-12-16 ENCOUNTER — Encounter (HOSPITAL_COMMUNITY): Payer: Self-pay | Admitting: Anesthesiology

## 2018-12-16 ENCOUNTER — Ambulatory Visit (HOSPITAL_COMMUNITY)
Admission: RE | Admit: 2018-12-16 | Discharge: 2018-12-16 | Disposition: A | Discharge status: Home / Self Care | Payer: Self-pay | Source: Ambulatory Visit | Attending: General Surgery | Admitting: General Surgery

## 2018-12-16 DIAGNOSIS — K4090 Unilateral inguinal hernia, without obstruction or gangrene, not specified as recurrent: Secondary | ICD-10-CM | POA: Insufficient documentation

## 2018-12-16 DIAGNOSIS — F17210 Nicotine dependence, cigarettes, uncomplicated: Secondary | ICD-10-CM | POA: Insufficient documentation

## 2018-12-16 HISTORY — PX: INGUINAL HERNIA REPAIR: SHX194

## 2018-12-16 SURGERY — REPAIR, HERNIA, INGUINAL, ADULT
Anesthesia: General | Site: Inguinal | Laterality: Right

## 2018-12-16 MED ORDER — PROMETHAZINE HCL 25 MG/ML IJ SOLN
INTRAMUSCULAR | Status: DC | PRN
  Administered 2018-12-16: 12.5 mg via INTRAVENOUS

## 2018-12-16 MED ORDER — ONDANSETRON HCL 4 MG/2ML IJ SOLN
INTRAMUSCULAR | Status: DC | PRN
  Administered 2018-12-16: 4 mg via INTRAVENOUS

## 2018-12-16 MED ORDER — HYDROMORPHONE HCL 1 MG/ML IJ SOLN
0.2500 mg | INTRAMUSCULAR | Status: DC | PRN
  Administered 2018-12-16: 0.5 mg via INTRAVENOUS
  Filled 2018-12-16: qty 0.5

## 2018-12-16 MED ORDER — FENTANYL CITRATE (PF) 100 MCG/2ML IJ SOLN
INTRAMUSCULAR | Status: DC | PRN
  Administered 2018-12-16 (×3): 50 ug via INTRAVENOUS
  Administered 2018-12-16: 100 ug via INTRAVENOUS

## 2018-12-16 MED ORDER — PROMETHAZINE HCL 25 MG/ML IJ SOLN
INTRAMUSCULAR | Status: AC
  Filled 2018-12-16: qty 1

## 2018-12-16 MED ORDER — PROMETHAZINE HCL 25 MG/ML IJ SOLN: 6.2500 mg | INTRAMUSCULAR | Status: DC | PRN

## 2018-12-16 MED ORDER — GLYCOPYRROLATE PF 0.2 MG/ML IJ SOSY
PREFILLED_SYRINGE | INTRAMUSCULAR | Status: DC | PRN
  Administered 2018-12-16: .2 mg via INTRAVENOUS

## 2018-12-16 MED ORDER — BUPIVACAINE LIPOSOME 1.3 % IJ SUSP
INTRAMUSCULAR | Status: AC
  Filled 2018-12-16: qty 20

## 2018-12-16 MED ORDER — LIDOCAINE HCL (CARDIAC) PF 100 MG/5ML IV SOSY
PREFILLED_SYRINGE | INTRAVENOUS | Status: DC | PRN
  Administered 2018-12-16: 50 mg via INTRAVENOUS

## 2018-12-16 MED ORDER — CHLORHEXIDINE GLUCONATE CLOTH 2 % EX PADS: 6.0000 | MEDICATED_PAD | Freq: Once | CUTANEOUS | Status: DC

## 2018-12-16 MED ORDER — SUCCINYLCHOLINE CHLORIDE 200 MG/10ML IV SOSY
PREFILLED_SYRINGE | INTRAVENOUS | Status: AC
  Filled 2018-12-16: qty 10

## 2018-12-16 MED ORDER — HYDROCODONE-ACETAMINOPHEN 7.5-325 MG PO TABS
1.0000 | ORAL_TABLET | Freq: Once | ORAL | Status: AC | PRN
  Administered 2018-12-16: 1 via ORAL
  Filled 2018-12-16: qty 1

## 2018-12-16 MED ORDER — HYDROCODONE-ACETAMINOPHEN 5-325 MG PO TABS: 1.0000 | ORAL_TABLET | ORAL | 0 refills | Status: AC | PRN

## 2018-12-16 MED ORDER — ROCURONIUM BROMIDE 10 MG/ML (PF) SYRINGE
PREFILLED_SYRINGE | INTRAVENOUS | Status: AC
  Filled 2018-12-16: qty 30

## 2018-12-16 MED ORDER — FENTANYL CITRATE (PF) 250 MCG/5ML IJ SOLN
INTRAMUSCULAR | Status: AC
  Filled 2018-12-16: qty 5

## 2018-12-16 MED ORDER — LACTATED RINGERS IV SOLN
INTRAVENOUS | Status: DC
  Administered 2018-12-16: 09:00:00 via INTRAVENOUS

## 2018-12-16 MED ORDER — PROPOFOL 10 MG/ML IV BOLUS
INTRAVENOUS | Status: AC
  Filled 2018-12-16: qty 40

## 2018-12-16 MED ORDER — MIDAZOLAM HCL 2 MG/2ML IJ SOLN: 0.5000 mg | Freq: Once | INTRAMUSCULAR | Status: DC | PRN

## 2018-12-16 MED ORDER — BUPIVACAINE LIPOSOME 1.3 % IJ SUSP
INTRAMUSCULAR | Status: DC | PRN
  Administered 2018-12-16: 20 mL

## 2018-12-16 MED ORDER — CEFAZOLIN SODIUM-DEXTROSE 2-4 GM/100ML-% IV SOLN
2.0000 g | INTRAVENOUS | Status: AC
  Administered 2018-12-16: 10:00:00 2 g via INTRAVENOUS
  Filled 2018-12-16: qty 100

## 2018-12-16 MED ORDER — PROPOFOL 10 MG/ML IV BOLUS
INTRAVENOUS | Status: DC | PRN
  Administered 2018-12-16: 250 mg via INTRAVENOUS
  Administered 2018-12-16: 50 mg via INTRAVENOUS

## 2018-12-16 MED ORDER — LIDOCAINE 2% (20 MG/ML) 5 ML SYRINGE
INTRAMUSCULAR | Status: AC
  Filled 2018-12-16: qty 10

## 2018-12-16 MED ORDER — SODIUM CHLORIDE 0.9 % IR SOLN
Status: DC | PRN
  Administered 2018-12-16: 1000 mL

## 2018-12-16 MED ORDER — KETOROLAC TROMETHAMINE 30 MG/ML IJ SOLN
30.0000 mg | Freq: Once | INTRAMUSCULAR | Status: AC
  Administered 2018-12-16: 30 mg via INTRAVENOUS
  Filled 2018-12-16: qty 1

## 2018-12-16 SURGICAL SUPPLY — 35 items
BRUSH SCRUB SURG 4.25 DISP (MISCELLANEOUS) ×2 IMPLANT
CLOTH BEACON ORANGE TIMEOUT ST (SAFETY) ×2 IMPLANT
COVER LIGHT HANDLE STERIS (MISCELLANEOUS) ×4 IMPLANT
COVER WAND RF STERILE (DRAPES) ×2 IMPLANT
DERMABOND ADVANCED (GAUZE/BANDAGES/DRESSINGS) ×1
DERMABOND ADVANCED .7 DNX12 (GAUZE/BANDAGES/DRESSINGS) ×1 IMPLANT
DRAIN PENROSE 18X1/2 LTX STRL (DRAIN) ×2 IMPLANT
DRAPE UTILITY W/TAPE 26X15 (DRAPES) ×2 IMPLANT
ELECT REM PT RETURN 9FT ADLT (ELECTROSURGICAL) ×2
ELECTRODE REM PT RTRN 9FT ADLT (ELECTROSURGICAL) ×1 IMPLANT
GAUZE SPONGE 4X4 12PLY STRL (GAUZE/BANDAGES/DRESSINGS) ×2 IMPLANT
GLOVE BIOGEL PI IND STRL 7.0 (GLOVE) ×3 IMPLANT
GLOVE BIOGEL PI INDICATOR 7.0 (GLOVE) ×3
GLOVE ECLIPSE 6.5 STRL STRAW (GLOVE) ×4 IMPLANT
GLOVE SURG SS PI 7.5 STRL IVOR (GLOVE) ×2 IMPLANT
GOWN STRL REUS W/TWL LRG LVL3 (GOWN DISPOSABLE) ×6 IMPLANT
INST SET MINOR GENERAL (KITS) ×2 IMPLANT
KIT TURNOVER KIT A (KITS) ×2 IMPLANT
MANIFOLD NEPTUNE II (INSTRUMENTS) ×2 IMPLANT
MESH MARLEX PLUG MEDIUM (Mesh General) ×2 IMPLANT
NEEDLE HYPO 18GX1.5 BLUNT FILL (NEEDLE) ×2 IMPLANT
NEEDLE HYPO 21X1.5 SAFETY (NEEDLE) ×2 IMPLANT
NS IRRIG 1000ML POUR BTL (IV SOLUTION) ×2 IMPLANT
PACK MINOR (CUSTOM PROCEDURE TRAY) ×2 IMPLANT
PAD ARMBOARD 7.5X6 YLW CONV (MISCELLANEOUS) ×2 IMPLANT
SET BASIN LINEN APH (SET/KITS/TRAYS/PACK) ×2 IMPLANT
SOL PREP PROV IODINE SCRUB 4OZ (MISCELLANEOUS) ×2 IMPLANT
SUT MNCRL AB 4-0 PS2 18 (SUTURE) ×2 IMPLANT
SUT NOVA NAB GS-22 2 2-0 T-19 (SUTURE) ×6 IMPLANT
SUT VIC AB 2-0 CT1 27 (SUTURE) ×1
SUT VIC AB 2-0 CT1 TAPERPNT 27 (SUTURE) ×1 IMPLANT
SUT VIC AB 3-0 SH 27 (SUTURE) ×1
SUT VIC AB 3-0 SH 27X BRD (SUTURE) ×1 IMPLANT
SUT VICRYL AB 3 0 TIES (SUTURE) ×2 IMPLANT
SYR 20ML LL LF (SYRINGE) ×4 IMPLANT

## 2018-12-16 NOTE — Discharge Instructions (Signed)
Open Hernia Repair, Adult, Care After °This sheet gives you information about how to care for yourself after your procedure. Your health care provider may also give you more specific instructions. If you have problems or questions, contact your health care provider. °What can I expect after the procedure? °After the procedure, it is common to have: °· Mild discomfort. °· Slight bruising. °· Minor swelling. °· Pain in the abdomen. °Follow these instructions at home: °Incision care ° °· Follow instructions from your health care provider about how to take care of your incision area. Make sure you: °? Wash your hands with soap and water before you change your bandage (dressing). If soap and water are not available, use hand sanitizer. °? Change your dressing as told by your health care provider. °? Leave stitches (sutures), skin glue, or adhesive strips in place. These skin closures may need to stay in place for 2 weeks or longer. If adhesive strip edges start to loosen and curl up, you may trim the loose edges. Do not remove adhesive strips completely unless your health care provider tells you to do that. °· Check your incision area every day for signs of infection. Check for: °? More redness, swelling, or pain. °? More fluid or blood. °? Warmth. °? Pus or a bad smell. °Activity °· Do not drive or use heavy machinery while taking prescription pain medicine. Do not drive until your health care provider approves. °· Until your health care provider approves: °? Do not lift anything that is heavier than 10 lb (4.5 kg). °? Do not play contact sports. °· Return to your normal activities as told by your health care provider. Ask your health care provider what activities are safe. °General instructions °· To prevent or treat constipation while you are taking prescription pain medicine, your health care provider may recommend that you: °? Drink enough fluid to keep your urine clear or pale yellow. °? Take over-the-counter or  prescription medicines. °? Eat foods that are high in fiber, such as fresh fruits and vegetables, whole grains, and beans. °? Limit foods that are high in fat and processed sugars, such as fried and sweet foods. °· Take over-the-counter and prescription medicines only as told by your health care provider. °· Do not take tub baths or go swimming until your health care provider approves. °· Keep all follow-up visits as told by your health care provider. This is important. °Contact a health care provider if: °· You develop a rash. °· You have more redness, swelling, or pain around your incision. °· You have more fluid or blood coming from your incision. °· Your incision feels warm to the touch. °· You have pus or a bad smell coming from your incision. °· You have a fever or chills. °· You have blood in your stool (feces). °· You have not had a bowel movement in 2-3 days. °· Your pain is not controlled with medicine. °Get help right away if: °· You have chest pain or shortness of breath. °· You feel light-headed or feel faint. °· You have severe pain. °· You vomit and your pain is worse. °This information is not intended to replace advice given to you by your health care provider. Make sure you discuss any questions you have with your health care provider. °Document Released: 07/27/2004 Document Revised: 12/20/2016 Document Reviewed: 06/21/2015 °Elsevier Patient Education © 2020 [employer] ° °

## 2018-12-16 NOTE — Op Note (Signed)
Patient:  Sean Hanson  DOB:  October 25, 1995  MRN:  [id number]   Preop Diagnosis: Right inguinal hernia  Postop Diagnosis: Same  Procedure: Right inguinal herniorrhaphy with mesh  Surgeon: Aviva Signs, MD  Anes: General endotracheal  Indications: Patient is a 23 year old white male who presents with a symptomatic right inguinal hernia.  The risks and benefits of the procedure including bleeding, infection, mesh use, the possibility of recurrence of the hernia were fully explained to the patient, who gave informed consent.  Procedure note: The patient was placed in supine position.  After induction of general endotracheal anesthesia, the right groin region was prepped and draped using the usual sterile technique with Betadine.  Surgical site confirmation was performed.  An incision was made in the right groin region down to the external oblique aponeurosis.  The aponeurosis was incised to the external ring.  A Penrose drain was placed around the spermatic cord.  The vas deferens was noted within the spermatic cord.  The ilioinguinal nerve was identified and retracted inferiorly from the operative field.  The patient was noted to have an indirect hernia.  This was freed away from the spermatic cord up to the peritoneal reflection and inverted.  A medium size Bard PerFix plug was then inserted.  An onlay patch was placed along the floor of the inguinal canal and secured superiorly to the conjoined tendon and inferiorly to the shelving edge of Poupart's ligament using 2-0 Novafil interrupted sutures.  The internal ring was recreated using a 2-0 Novafil interrupted suture.  The external oblique aponeurosis was reapproximated using a 2-0 Vicryl running suture.  The subcutaneous layer was reapproximated using a 3-0 Vicryl interrupted suture.  Exparel was instilled into the surrounding wound.  The skin was closed using a 4-0 Monocryl subcuticular suture.  Dermabond was applied.  All tape and needle  counts were correct at the end of the procedure.  The patient was extubated in the operating room and transferred to PACU in stable condition.  Complications: None  EBL: Minimal  Specimen: None

## 2018-12-16 NOTE — Anesthesia Procedure Notes (Signed)
Procedure Name: LMA Insertion Date/Time: 12/16/2018 9:48 AM Performed by: Adams, Amy A, CRNA Pre-anesthesia Checklist: Patient identified, Emergency Drugs available, Suction available, Patient being monitored and Timeout performed Patient Re-evaluated:Patient Re-evaluated prior to induction Oxygen Delivery Method: Circle system utilized Preoxygenation: Pre-oxygenation with 100% oxygen Induction Type: IV induction LMA: LMA inserted LMA Size: 4.0 Placement Confirmation: positive ETCO2 and breath sounds checked- equal and bilateral Tube secured with: Tape Dental Injury: Teeth and Oropharynx as per pre-operative assessment

## 2018-12-16 NOTE — Interval H&P Note (Signed)
History and Physical Interval Note:  12/16/2018 9:21 AM  Sean Hanson  has presented today for surgery, with the diagnosis of right inguinal hernia.  The various methods of treatment have been discussed with the patient and family. After consideration of risks, benefits and other options for treatment, the patient has consented to  Procedure(s): HERNIA REPAIR INGUINAL ADULT WITH MESH (Right) as a surgical intervention.  The patient's history has been reviewed, patient examined, no change in status, stable for surgery.  I have reviewed the patient's chart and labs.  Questions were answered to the patient's satisfaction.     Aviva Signs

## 2018-12-16 NOTE — Transfer of Care (Signed)
Immediate Anesthesia Transfer of Care Note  Patient: Sean Hanson  Procedure(s) Performed: RIGHT INGUINAL HERNIORRHAPHY WITH MESH (Right Inguinal)  Patient Location: PACU  Anesthesia Type:General  Level of Consciousness: awake, oriented and patient cooperative  Airway & Oxygen Therapy: Patient Spontanous Breathing and Patient connected to face mask oxygen  Post-op Assessment: Report given to RN and Post -op Vital signs reviewed and stable  Post vital signs: Reviewed and stable  Last Vitals:  Vitals Value Taken Time  BP 127/94 12/16/18 1050  Temp    Pulse 63 12/16/18 1056  Resp 14 12/16/18 1056  SpO2 100 % 12/16/18 1056  Vitals shown include unvalidated device data.  Last Pain:  Vitals:   12/16/18 0841  TempSrc: Oral  PainSc: 0-No pain      Patients Stated Pain Goal: 5 (10/62/69 4854)  Complications: No apparent anesthesia complications

## 2018-12-16 NOTE — Anesthesia Postprocedure Evaluation (Signed)
Anesthesia Post Note Late entry for 1155  Patient: Sean Hanson  Procedure(s) Performed: RIGHT INGUINAL HERNIORRHAPHY WITH MESH (Right Inguinal)  Patient location during evaluation: PACU Anesthesia Type: General Level of consciousness: awake and alert and oriented Pain management: pain level controlled Vital Signs Assessment: post-procedure vital signs reviewed and stable Respiratory status: spontaneous breathing Cardiovascular status: stable Postop Assessment: no apparent nausea or vomiting Anesthetic complications: no     Last Vitals:  Vitals:   12/16/18 1130 12/16/18 1156  BP: (!) 145/86 139/82  Pulse: 67 60  Resp: 17 16  Temp:  36.7 C  SpO2: 100% 95%    Last Pain:  Vitals:   12/16/18 1156  TempSrc: Oral  PainSc: 6                  ADAMS, AMY A

## 2018-12-16 NOTE — Anesthesia Preprocedure Evaluation (Addendum)
Anesthesia Evaluation  Patient identified by MRN, date of birth, ID band Patient awake    Reviewed: Allergy & Precautions, NPO status , Patient's Chart, lab work & pertinent test results  Airway Mallampati: II  TM Distance: >3 FB Neck ROM: Full    Dental no notable dental hx. (+) Poor Dentition   Pulmonary neg pulmonary ROS, Current SmokerPatient did not abstain from smoking.,    Pulmonary exam normal breath sounds clear to auscultation       Cardiovascular Exercise Tolerance: Good negative cardio ROS Normal cardiovascular examI Rhythm:Regular Rate:Normal     Neuro/Psych negative neurological ROS  negative psych ROS   GI/Hepatic negative GI ROS, Neg liver ROS,   Endo/Other  negative endocrine ROS  Renal/GU negative Renal ROS  negative genitourinary   Musculoskeletal negative musculoskeletal ROS (+)   Abdominal   Peds negative pediatric ROS (+)  Hematology negative hematology ROS (+)   Anesthesia Other Findings   Reproductive/Obstetrics negative OB ROS                             Anesthesia Physical Anesthesia Plan  ASA: II  Anesthesia Plan: General   Post-op Pain Management:    Induction: Intravenous  PONV Risk Score and Plan: 1 and Ondansetron, Midazolam and Treatment may vary due to age or medical condition  Airway Management Planned: LMA  Additional Equipment:   Intra-op Plan:   Post-operative Plan:   Informed Consent: I have reviewed the patients History and Physical, chart, labs and discussed the procedure including the risks, benefits and alternatives for the proposed anesthesia with the patient or authorized representative who has indicated his/her understanding and acceptance.     Dental advisory given  Plan Discussed with: CRNA  Anesthesia Plan Comments: (Plan Full PPE use  Plan GA (LMA) vs.  GETA as needed d/w pt -WTP with same after Q&A)         Anesthesia Quick Evaluation

## 2018-12-19 ENCOUNTER — Encounter (HOSPITAL_COMMUNITY): Payer: Self-pay | Admitting: General Surgery

## 2018-12-30 ENCOUNTER — Other Ambulatory Visit: Payer: Self-pay

## 2018-12-30 ENCOUNTER — Telehealth: Payer: Self-pay | Admitting: General Surgery

## 2018-12-30 ENCOUNTER — Telehealth (INDEPENDENT_AMBULATORY_CARE_PROVIDER_SITE_OTHER): Payer: Self-pay | Admitting: General Surgery

## 2018-12-30 DIAGNOSIS — Z09 Encounter for follow-up examination after completed treatment for conditions other than malignant neoplasm: Secondary | ICD-10-CM

## 2018-12-30 NOTE — Telephone Encounter (Signed)
Telephone visit performed.  Patient doing well.  Was having some incisional pain when he started lifting something heavy and twisted the wrong way.  I did tell him he needs to be careful when he does something like this.  He was told to take Motrin or Advil as needed for pain.  He was instructed to call the office should any issues arise.

## 2019-01-18 ENCOUNTER — Emergency Department (HOSPITAL_COMMUNITY)
Admission: EM | Admit: 2019-01-18 | Discharge: 2019-01-18 | Disposition: A | Discharge status: Home / Self Care | Payer: Self-pay | Attending: Emergency Medicine | Admitting: Emergency Medicine

## 2019-01-18 ENCOUNTER — Emergency Department (HOSPITAL_COMMUNITY): Payer: Self-pay

## 2019-01-18 ENCOUNTER — Encounter (HOSPITAL_COMMUNITY): Payer: Self-pay

## 2019-01-18 ENCOUNTER — Other Ambulatory Visit: Payer: Self-pay

## 2019-01-18 DIAGNOSIS — F17210 Nicotine dependence, cigarettes, uncomplicated: Secondary | ICD-10-CM | POA: Insufficient documentation

## 2019-01-18 DIAGNOSIS — M5410 Radiculopathy, site unspecified: Secondary | ICD-10-CM | POA: Insufficient documentation

## 2019-01-18 LAB — CBC WITH DIFFERENTIAL/PLATELET
Abs Immature Granulocytes: 0.01 10*3/uL (ref 0.00–0.07)
Basophils Absolute: 0 10*3/uL (ref 0.0–0.1)
Basophils Relative: 0 %
Eosinophils Absolute: 0.1 10*3/uL (ref 0.0–0.5)
Eosinophils Relative: 1 %
HCT: 46.6 % (ref 39.0–52.0)
Hemoglobin: 15.7 g/dL (ref 13.0–17.0)
Immature Granulocytes: 0 %
Lymphocytes Relative: 30 %
Lymphs Abs: 2.3 10*3/uL (ref 0.7–4.0)
MCH: 29.7 pg (ref 26.0–34.0)
MCHC: 33.7 g/dL (ref 30.0–36.0)
MCV: 88.1 fL (ref 80.0–100.0)
Monocytes Absolute: 0.5 10*3/uL (ref 0.1–1.0)
Monocytes Relative: 6 %
Neutro Abs: 4.8 10*3/uL (ref 1.7–7.7)
Neutrophils Relative %: 63 %
Platelets: 107 10*3/uL — ABNORMAL LOW (ref 150–400)
RBC: 5.29 MIL/uL (ref 4.22–5.81)
RDW: 12.4 % (ref 11.5–15.5)
WBC: 7.7 10*3/uL (ref 4.0–10.5)
nRBC: 0 % (ref 0.0–0.2)

## 2019-01-18 LAB — COMPREHENSIVE METABOLIC PANEL
ALT: 19 U/L (ref 0–44)
AST: 23 U/L (ref 15–41)
Albumin: 4.8 g/dL (ref 3.5–5.0)
Alkaline Phosphatase: 68 U/L (ref 38–126)
Anion gap: 14 (ref 5–15)
BUN: 9 mg/dL (ref 6–20)
CO2: 25 mmol/L (ref 22–32)
Calcium: 9.7 mg/dL (ref 8.9–10.3)
Chloride: 102 mmol/L (ref 98–111)
Creatinine, Ser: 0.82 mg/dL (ref 0.61–1.24)
GFR calc Af Amer: >60 mL/min (ref >60)
GFR calc non Af Amer: >60 mL/min (ref >60)
Glucose, Bld: 106 mg/dL — ABNORMAL HIGH (ref 70–99)
Potassium: 4.6 mmol/L (ref 3.5–5.1)
Sodium: 141 mmol/L (ref 135–145)
Total Bilirubin: 0.6 mg/dL (ref 0.3–1.2)
Total Protein: 8 g/dL (ref 6.5–8.1)

## 2019-01-18 LAB — URINALYSIS, ROUTINE W REFLEX MICROSCOPIC
Bilirubin Urine: NEGATIVE
Glucose, UA: NEGATIVE mg/dL
Hgb urine dipstick: NEGATIVE
Ketones, ur: NEGATIVE mg/dL
Leukocytes,Ua: NEGATIVE
Nitrite: NEGATIVE
Protein, ur: NEGATIVE mg/dL
Specific Gravity, Urine: 1.017 (ref 1.005–1.030)
pH: 6 (ref 5.0–8.0)

## 2019-01-18 MED ORDER — IBUPROFEN 800 MG PO TABS
800.0000 mg | ORAL_TABLET | Freq: Once | ORAL | Status: AC
  Administered 2019-01-18: 800 mg via ORAL
  Filled 2019-01-18: qty 1

## 2019-01-18 MED ORDER — IBUPROFEN 800 MG PO TABS: 800.0000 mg | ORAL_TABLET | Freq: Three times a day (TID) | ORAL | 0 refills | Status: AC

## 2019-01-18 MED ORDER — CYCLOBENZAPRINE HCL 10 MG PO TABS: 10.0000 mg | ORAL_TABLET | Freq: Three times a day (TID) | ORAL | 0 refills | Status: AC | PRN

## 2019-01-18 NOTE — ED Triage Notes (Signed)
Pt reports tested positive for covid last Wednesday.  Reports was laying in bed and felt something "pop" in left side of abd and suddenly had a warm/numbing sensation from the waste down.  Pt says now feels numbness in r leg.  Reports had hernia surgery in Nov.  Also c/o sob.

## 2019-01-18 NOTE — Discharge Instructions (Addendum)
Try alternating ice and heat to your left lower side.  Follow-up with your primary doctor or return to the ER for any worsening symptoms.

## 2019-01-18 NOTE — ED Provider Notes (Signed)
[facility] EMERGENCY DEPARTMENT Provider Note   CSN: [id number] Arrival date & time: 01/18/19  8182     History Chief Complaint  Patient presents with  . covid    Sean Hanson is a 23 y.o. male.  HPI      Sean Hanson is a 23 y.o. male who tested positive for COVID-19 5 days ago.  He presents to the Emergency Department complaining of sudden onset of pain and feeling a "pop" to his left lower abdomen that occurred this morning while lying in bed.  He states that he was laying on his right side when he felt a popping sensation followed by a warm tingly sensation that radiated across his lower abdomen and down into both legs.  He states that he now feels numbness and tingling into his right leg and "tightness" of his right knee.  History of right-sided inguinal hernia surgery in November.  He states the area has intermittently been draining green-colored "pus."  This morning, his symptoms have been associated with generalized shaking and chills.  He denies chest pain, back pain, fever, dysuria, pain to his groin or testicles.  No recent injuries or pain to his lower back.  No urine or bowel changes.  History reviewed. No pertinent past medical history.  Patient Active Problem List   Diagnosis Date Noted  . Right inguinal hernia   . FINGER SPRAIN 03/30/2009    Past Surgical History:  Procedure Laterality Date  . INGUINAL HERNIA REPAIR Right 12/16/2018   Procedure: RIGHT INGUINAL HERNIORRHAPHY WITH MESH;  Surgeon: Franky Macho, MD;  Location: [facility];  Service: General;  Laterality: Right;  . MYRINGOTOMY    . tubes in ears         No family history on file.  Social History   Tobacco Use  . Smoking status: Current Every Day Smoker    Packs/day: 1.00    Types: Cigarettes  . Smokeless tobacco: Never Used  Substance Use Topics  . Alcohol use: Yes    Comment: occ  . Drug use: No    Home Medications Prior to Admission medications   Medication Sig Start Date End  Date Taking? Authorizing Provider  guaiFENesin-codeine (ROBITUSSIN AC) 100-10 MG/5ML syrup Take 10 mLs by mouth 3 (three) times daily as needed. Patient not taking: Reported on 12/08/2018 12/19/16   Pauline Aus, PA-C  HYDROcodone-acetaminophen (NORCO) 5-325 MG tablet Take 1 tablet by mouth every 4 (four) hours as needed for moderate pain. 12/16/18   Franky Macho, MD  ibuprofen (ADVIL) 600 MG tablet Take 1 tablet (600 mg total) by mouth every 6 (six) hours as needed. Patient not taking: Reported on 12/08/2018 05/25/18   Burgess Amor, PA-C  magic mouthwash w/lidocaine SOLN Take 5 mLs by mouth 3 (three) times daily as needed for mouth pain. Swish and spit, do not swallow Patient not taking: Reported on 12/08/2018 12/19/16   Preethi Scantlebury, PA-C  Phenylephrine-DM-GG-APAP (TYLENOL COLD/FLU SEVERE) 5-10-200-325 MG TABS Take 2 tablets by mouth daily as needed (cold).    [provider]    Allergies    Patient has no known allergies.  Review of Systems   Review of Systems  Constitutional: Positive for chills and fever. Negative for appetite change and fatigue.  HENT: Negative for congestion and trouble swallowing.   Respiratory: Positive for shortness of breath. Negative for cough and chest tightness.   Cardiovascular: Positive for chest pain (left lower chest wall pain).  Gastrointestinal: Positive for abdominal pain. Negative for  diarrhea, nausea and vomiting.  Genitourinary: Negative for difficulty urinating, discharge, dysuria, flank pain, penile swelling, scrotal swelling and testicular pain.  Musculoskeletal: Negative for back pain, neck pain and neck stiffness.  Skin: Negative for color change, rash and wound.  Neurological: Positive for numbness ("numbness and tingling" to right leg). Negative for syncope and weakness.    Physical Exam Updated Vital Signs BP 132/90   Pulse 92   Temp 98.2 F (36.8 C) (Oral)   Resp 18   Ht 6' (1.829 m)   Wt 79.4 kg   SpO2 99%   BMI  23.73 kg/m   Physical Exam Vitals and nursing note reviewed.  Constitutional:      General: He is not in acute distress.    Appearance: Normal appearance. He is well-developed. He is not ill-appearing or toxic-appearing.  HENT:     Head: Atraumatic.     Mouth/Throat:     Mouth: Mucous membranes are moist.  Cardiovascular:     Rate and Rhythm: Normal rate and regular rhythm.     Pulses: Normal pulses.     Comments: DP pulses are strong and palpable bilaterally Pulmonary:     Effort: Pulmonary effort is normal. No respiratory distress.     Breath sounds: Normal breath sounds.  Abdominal:     General: There is no distension.     Palpations: Abdomen is soft.     Tenderness: There is no abdominal tenderness. There is no right CVA tenderness, left CVA tenderness or rebound.  Musculoskeletal:        General: Tenderness present. No swelling.     Cervical back: Normal range of motion and neck supple.     Lumbar back: Tenderness present. No swelling, deformity or lacerations. Normal range of motion.     Comments: No spinal tenderness.  Pt has 5/5 strength against resistance of bilateral lower extremities.  Neg SLR bilaterally.  Hip and knee flexors and extensors intact   Skin:    General: Skin is warm.     Capillary Refill: Capillary refill takes less than 2 seconds.     Findings: No erythema or rash.  Neurological:     Mental Status: He is alert and oriented to person, place, and time.     Sensory: Sensation is intact. No sensory deficit.     Motor: Motor function is intact. No weakness or abnormal muscle tone.     Coordination: Coordination is intact. Coordination normal.     Gait: Gait is intact.     Deep Tendon Reflexes:     Reflex Scores:      Patellar reflexes are 2+ on the right side and 2+ on the left side.      Achilles reflexes are 2+ on the right side and 2+ on the left side.    Comments: CN II-XII grossly intact.  Speech clear.      ED Results / Procedures /  Treatments   Labs (all labs ordered are listed, but only abnormal results are displayed) Labs Reviewed  CBC WITH DIFFERENTIAL/PLATELET - Abnormal; Notable for the following components:      Result Value   Platelets 107 (*)    All other components within normal limits  COMPREHENSIVE METABOLIC PANEL - Abnormal; Notable for the following components:   Glucose, Bld 106 (*)    All other components within normal limits  URINALYSIS, ROUTINE W REFLEX MICROSCOPIC    EKG None  Radiology DG Ribs Unilateral W/Chest Left  Result Date: 01/18/2019 CLINICAL DATA:  Left lower rib pain.  COVID-19 positive EXAM: LEFT RIBS AND CHEST - 3+ VIEW COMPARISON:  12/19/2016 FINDINGS: No fracture or other bone lesions are seen involving the ribs. There is no evidence of pneumothorax or pleural effusion. Both lungs are clear. Heart size and mediastinal contours are within normal limits. IMPRESSION: No acute no acute left-sided rib fracture.  Lungs are clear. Electronically Signed   By: Davina Poke D.O.   On: 01/18/2019 09:02    Procedures Procedures (including critical care time)  Medications Ordered in ED Medications  ibuprofen (ADVIL) tablet 800 mg (has no administration in time range)    ED Course  I have reviewed the triage vital signs and the nursing notes.  Pertinent labs & imaging results that were available during my care of the patient were reviewed by me and considered in my medical decision making (see chart for details).    MDM Rules/Calculators/A&P                     COVID + pt with sudden burning sensation of left flank and radicular pain into the right LE.  He ambulates with steady gait.  Hx of right inguinal hernia surgery that appears to be healing well.  No clinical findings to suggest infection.  Likely radiculopathy.  He is otherwise well appearing.  Vitals reviewed , w/o tachycardia, hypoxia or tachypnea.  PERC neg.  Appears appropriate for d/c home, will continue to isolate at  home.  Strict return precautions.    al Clinical Impression(s) / ED Diagnoses Final diagnoses:  Radicular pain    Rx / DC Orders ED Discharge Orders    None       Kem Parkinson, PA-C 01/19/19 2201    Wyvonnia Dusky, MD 01/20/19 [phone]

## 2019-08-24 ENCOUNTER — Other Ambulatory Visit: Payer: Self-pay

## 2019-08-24 ENCOUNTER — Encounter (HOSPITAL_COMMUNITY): Payer: Self-pay | Admitting: *Deleted

## 2019-08-24 ENCOUNTER — Emergency Department (HOSPITAL_COMMUNITY): Payer: Self-pay

## 2019-08-24 ENCOUNTER — Emergency Department (HOSPITAL_COMMUNITY)
Admission: EM | Admit: 2019-08-24 | Discharge: 2019-08-24 | Disposition: A | Discharge status: Home / Self Care | Payer: Self-pay | Attending: Emergency Medicine | Admitting: Emergency Medicine

## 2019-08-24 DIAGNOSIS — F17210 Nicotine dependence, cigarettes, uncomplicated: Secondary | ICD-10-CM | POA: Insufficient documentation

## 2019-08-24 DIAGNOSIS — J069 Acute upper respiratory infection, unspecified: Secondary | ICD-10-CM | POA: Insufficient documentation

## 2019-08-24 MED ORDER — PREDNISONE 20 MG PO TABS: 40.0000 mg | ORAL_TABLET | Freq: Every day | ORAL | 0 refills | Status: AC

## 2019-08-24 MED ORDER — ALBUTEROL SULFATE HFA 108 (90 BASE) MCG/ACT IN AERS
1.0000 | INHALATION_SPRAY | Freq: Once | RESPIRATORY_TRACT | Status: AC
  Administered 2019-08-24: 2 via RESPIRATORY_TRACT
  Filled 2019-08-24: qty 6.7

## 2019-08-24 NOTE — ED Triage Notes (Signed)
Needs work note 

## 2019-08-24 NOTE — ED Triage Notes (Signed)
C/o cough, states he may have bronchitis

## 2019-08-24 NOTE — ED Provider Notes (Signed)
[facility] EMERGENCY DEPARTMENT Provider Note   CSN: [id number] Arrival date & time: 08/24/19  1042     History Chief Complaint  Patient presents with  . Cough    Sean Hanson is a 24 y.o. male who presents for evaluation of cough, nasal congestion, rhinorrhea that has been ongoing for last 5 days.  He states that he works in [employer] center and states that he is routinely exposed to dust.  He states that he commonly gets bronchitis.  He states that this feels like previous episodes of bronchitis.  He states he has been coughing and cough is productive of phlegm.  He feels like this coughing is affecting his breathing.  He states he is also had rhinorrhea, congestion.  He has not measured any fevers.  He states he does smoke and estimates that he smokes a pack of cigarettes a day.  No history of COPD or asthma.  He did not get Covid vaccinated.  He denies any known exposure to COVID-19.  The history is provided by the patient.       History reviewed. No pertinent past medical history.  Patient Active Problem List   Diagnosis Date Noted  . Right inguinal hernia   . FINGER SPRAIN 03/30/2009    Past Surgical History:  Procedure Laterality Date  . INGUINAL HERNIA REPAIR Right 12/16/2018   Procedure: RIGHT INGUINAL HERNIORRHAPHY WITH MESH;  Surgeon: Franky Macho, MD;  Location: [facility];  Service: General;  Laterality: Right;  . MYRINGOTOMY    . tubes in ears         No family history on file.  Social History   Tobacco Use  . Smoking status: Current Every Day Smoker    Packs/day: 1.00    Types: Cigarettes  . Smokeless tobacco: Never Used  Substance Use Topics  . Alcohol use: Yes    Comment: occ  . Drug use: No    Home Medications Prior to Admission medications   Medication Sig Start Date End Date Taking? Authorizing Provider  cyclobenzaprine (FLEXERIL) 10 MG tablet Take 1 tablet (10 mg total) by mouth 3 (three) times daily as needed. 01/18/19   Triplett, Tammy, PA-C    guaiFENesin-codeine (ROBITUSSIN AC) 100-10 MG/5ML syrup Take 10 mLs by mouth 3 (three) times daily as needed. Patient not taking: Reported on 12/08/2018 12/19/16   Pauline Aus, PA-C  HYDROcodone-acetaminophen (NORCO) 5-325 MG tablet Take 1 tablet by mouth every 4 (four) hours as needed for moderate pain. 12/16/18   Franky Macho, MD  ibuprofen (ADVIL) 800 MG tablet Take 1 tablet (800 mg total) by mouth 3 (three) times daily. Take with food 01/18/19   Triplett, Tammy, PA-C  magic mouthwash w/lidocaine SOLN Take 5 mLs by mouth 3 (three) times daily as needed for mouth pain. Swish and spit, do not swallow Patient not taking: Reported on 12/08/2018 12/19/16   Triplett, Tammy, PA-C  Phenylephrine-DM-GG-APAP (TYLENOL COLD/FLU SEVERE) 5-10-200-325 MG TABS Take 2 tablets by mouth daily as needed (cold).    [provider]  predniSONE (DELTASONE) 20 MG tablet Take 2 tablets (40 mg total) by mouth daily for 4 days. 08/24/19 08/28/19  Maxwell Caul, PA-C    Allergies    Patient has no known allergies.  Review of Systems   Review of Systems  Constitutional: Negative for fever.  Respiratory: Positive for cough. Negative for shortness of breath.   All other systems reviewed and are negative.   Physical Exam Updated Vital Signs BP 130/80 (BP  Location: Left Arm)   Pulse 76   Temp 98.5 F (36.9 C) (Oral)   Resp 20   Ht 6' (1.829 m)   Wt 75.8 kg   SpO2 95%   BMI 22.65 kg/m   Physical Exam Vitals and nursing note reviewed.  Constitutional:      Appearance: Normal appearance. He is well-developed.     Comments: Appears uncomfortable but no acute distress   HENT:     Head: Normocephalic and atraumatic.  Eyes:     General: Lids are normal.     Conjunctiva/sclera: Conjunctivae normal.     Pupils: Pupils are equal, round, and reactive to light.  Cardiovascular:     Rate and Rhythm: Normal rate and regular rhythm.     Pulses: Normal pulses.     Heart sounds: Normal heart sounds.  No murmur heard.  No friction rub. No gallop.   Pulmonary:     Effort: Pulmonary effort is normal.     Breath sounds: Wheezing present.     Comments: Mild wheezing noted in upper lung fields.  No evidence of respiratory distress.  Able speak in full sentences without any difficulty. Musculoskeletal:        General: Normal range of motion.     Cervical back: Full passive range of motion without pain.  Skin:    General: Skin is warm and dry.     Capillary Refill: Capillary refill takes less than 2 seconds.  Neurological:     Mental Status: He is alert and oriented to person, place, and time.  Psychiatric:        Speech: Speech normal.     ED Results / Procedures / Treatments   Labs (all labs ordered are listed, but only abnormal results are displayed) Labs Reviewed - No data to display  EKG None  Radiology DG Chest Portable 1 View  Result Date: 08/24/2019 CLINICAL DATA:  Cough for several days EXAM: PORTABLE CHEST 1 VIEW COMPARISON:  01/18/2019 FINDINGS: The heart size and mediastinal contours are within normal limits. Both lungs are clear. The visualized skeletal structures are unremarkable. IMPRESSION: No active disease. Electronically Signed   By: Alcide Clever M.D.   On: 08/24/2019 14:11    Procedures Procedures (including critical care time)  Medications Ordered in ED Medications  albuterol (VENTOLIN HFA) 108 (90 Base) MCG/ACT inhaler 1-2 puff (2 puffs Inhalation Given 08/24/19 1409)    ED Course  I have reviewed the triage vital signs and the nursing notes.  Pertinent labs & imaging results that were available during my care of the patient were reviewed by me and considered in my medical decision making (see chart for details).    MDM Rules/Calculators/A&P                          24 year old male who presents for evaluation of cough x5 days.  Associated rhinorrhea, congestion.  No fevers.  History of smoking a pack of cigarettes a day.  No history of COPD or asthma.   Did not get Covid vaccinated.  On initially arrival, he is afebrile, nontoxic-appearing.  Vital signs are stable.  On exam, some mild wheezing noted upper lung field.  No evidence of respiratory distress.  Concern for viral process such as bronchitis.  Also consider infectious process such as pneumonia.  We will plan for chest x-ray, albuterol.  Offered patient COVID-19 testing as he could be symptomatic for COVID-19.  Patient declined COVID-19 testing in  the ED.  Chest x-ray reviewed.  Negative for any acute abnormality.   Discussed results with patient.  Encouraged at home use of albuterol inhaler. At this time, patient exhibits no emergent life-threatening condition that require further evaluation in ED or admission. Patient had ample opportunity for questions and discussion. All patient's questions were answered with full understanding. Strict return precautions discussed. Patient expresses understanding and agreement to plan.    Sean Hanson was evaluated in Emergency Department on 08/24/2019 for the symptoms described in the history of present illness. He was evaluated in the context of the global COVID-19 pandemic, which necessitated consideration that the patient might be at risk for infection with the SARS-CoV-2 virus that causes COVID-19. Institutional protocols and algorithms that pertain to the evaluation of patients at risk for COVID-19 are in a state of rapid change based on information released by regulatory bodies including the CDC and federal and state organizations. These policies and algorithms were followed during the patient's care in the ED.  Portions of this note were generated with Scientist, clinical (histocompatibility and immunogenetics). Dictation errors may occur despite best attempts at proofreading.  Final Clinical Impression(s) / ED Diagnoses Final diagnoses:  Viral URI with cough    Rx / DC Orders ED Discharge Orders         Ordered    predniSONE (DELTASONE) 20 MG tablet  Daily     Discontinue   Reprint     08/24/19 1454           Rosana Hoes 08/24/19 1720    Mancel Bale, MD 08/25/19 [phone]

## 2019-08-24 NOTE — Discharge Instructions (Signed)
Use albuterol inhaler as directed.  Take prednisone as directed.  Return for any worsening cough, difficulty breathing, fevers or any other worsening concerning symptoms.

## 2019-12-21 ENCOUNTER — Telehealth: Payer: Self-pay

## 2019-12-21 NOTE — Telephone Encounter (Signed)
error 

## 2020-01-29 ENCOUNTER — Emergency Department (HOSPITAL_COMMUNITY): Payer: HRSA Program

## 2020-01-29 ENCOUNTER — Emergency Department (HOSPITAL_COMMUNITY)
Admission: EM | Admit: 2020-01-29 | Discharge: 2020-01-30 | Disposition: A | Discharge status: Home / Self Care | Payer: HRSA Program | Attending: Emergency Medicine | Admitting: Emergency Medicine

## 2020-01-29 ENCOUNTER — Other Ambulatory Visit: Payer: Self-pay

## 2020-01-29 DIAGNOSIS — M25512 Pain in left shoulder: Secondary | ICD-10-CM | POA: Diagnosis not present

## 2020-01-29 DIAGNOSIS — Z5321 Procedure and treatment not carried out due to patient leaving prior to being seen by health care provider: Secondary | ICD-10-CM | POA: Diagnosis not present

## 2020-01-29 DIAGNOSIS — R509 Fever, unspecified: Secondary | ICD-10-CM | POA: Diagnosis present

## 2020-01-29 DIAGNOSIS — U071 COVID-19: Secondary | ICD-10-CM | POA: Diagnosis not present

## 2020-01-29 MED ORDER — OXYCODONE-ACETAMINOPHEN 5-325 MG PO TABS
1.0000 | ORAL_TABLET | ORAL | Status: DC | PRN
  Administered 2020-01-29: 1 via ORAL
  Filled 2020-01-29: qty 1

## 2020-01-29 NOTE — ED Triage Notes (Signed)
Pt presents to ED POV. Pt c/o fever (101.2), Ha, L shoulder pain. Pt reports that fever began at 1900. Pt denies any covid exposures. Pt denies any injury to shoulder

## 2020-01-30 LAB — RESP PANEL BY RT-PCR (FLU A&B, COVID) ARPGX2
Influenza A by PCR: NEGATIVE
Influenza B by PCR: NEGATIVE
SARS Coronavirus 2 by RT PCR: POSITIVE — AB

## 2020-01-30 NOTE — ED Notes (Signed)
Pt left due to not being seen quick enough 

## 2020-01-31 ENCOUNTER — Telehealth: Payer: Self-pay

## 2020-01-31 NOTE — Telephone Encounter (Signed)
Attempted to reach pt. In regard to further treatment for COVID 19. Message left with Infusion hotline number [phone]. 

## 2021-11-03 IMAGING — DX DG CHEST 1V PORT
2 series · 2 of 2 positions shown · non-contrast
Comparison: 01/18/2019

CLINICAL DATA: Cough for several days

EXAM:
PORTABLE CHEST 1 VIEW

[chest ap (1 of 2)]
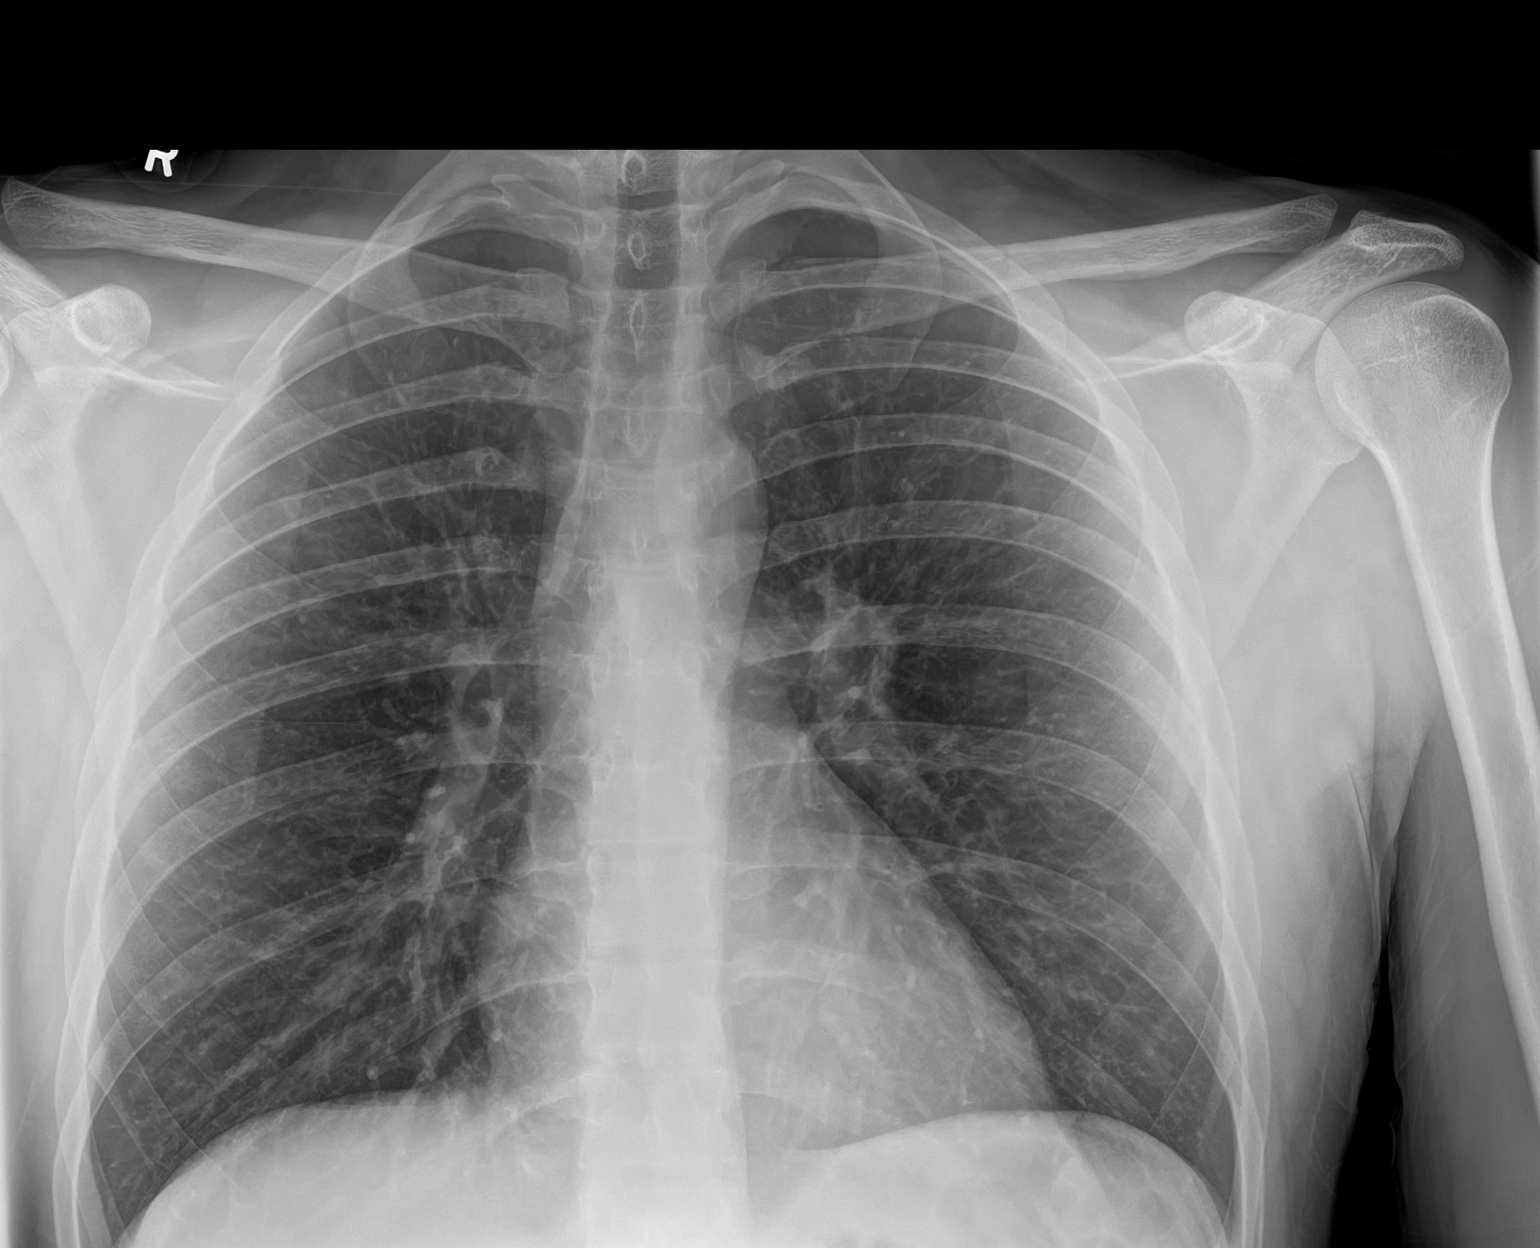

[chest ap (2 of 2)]
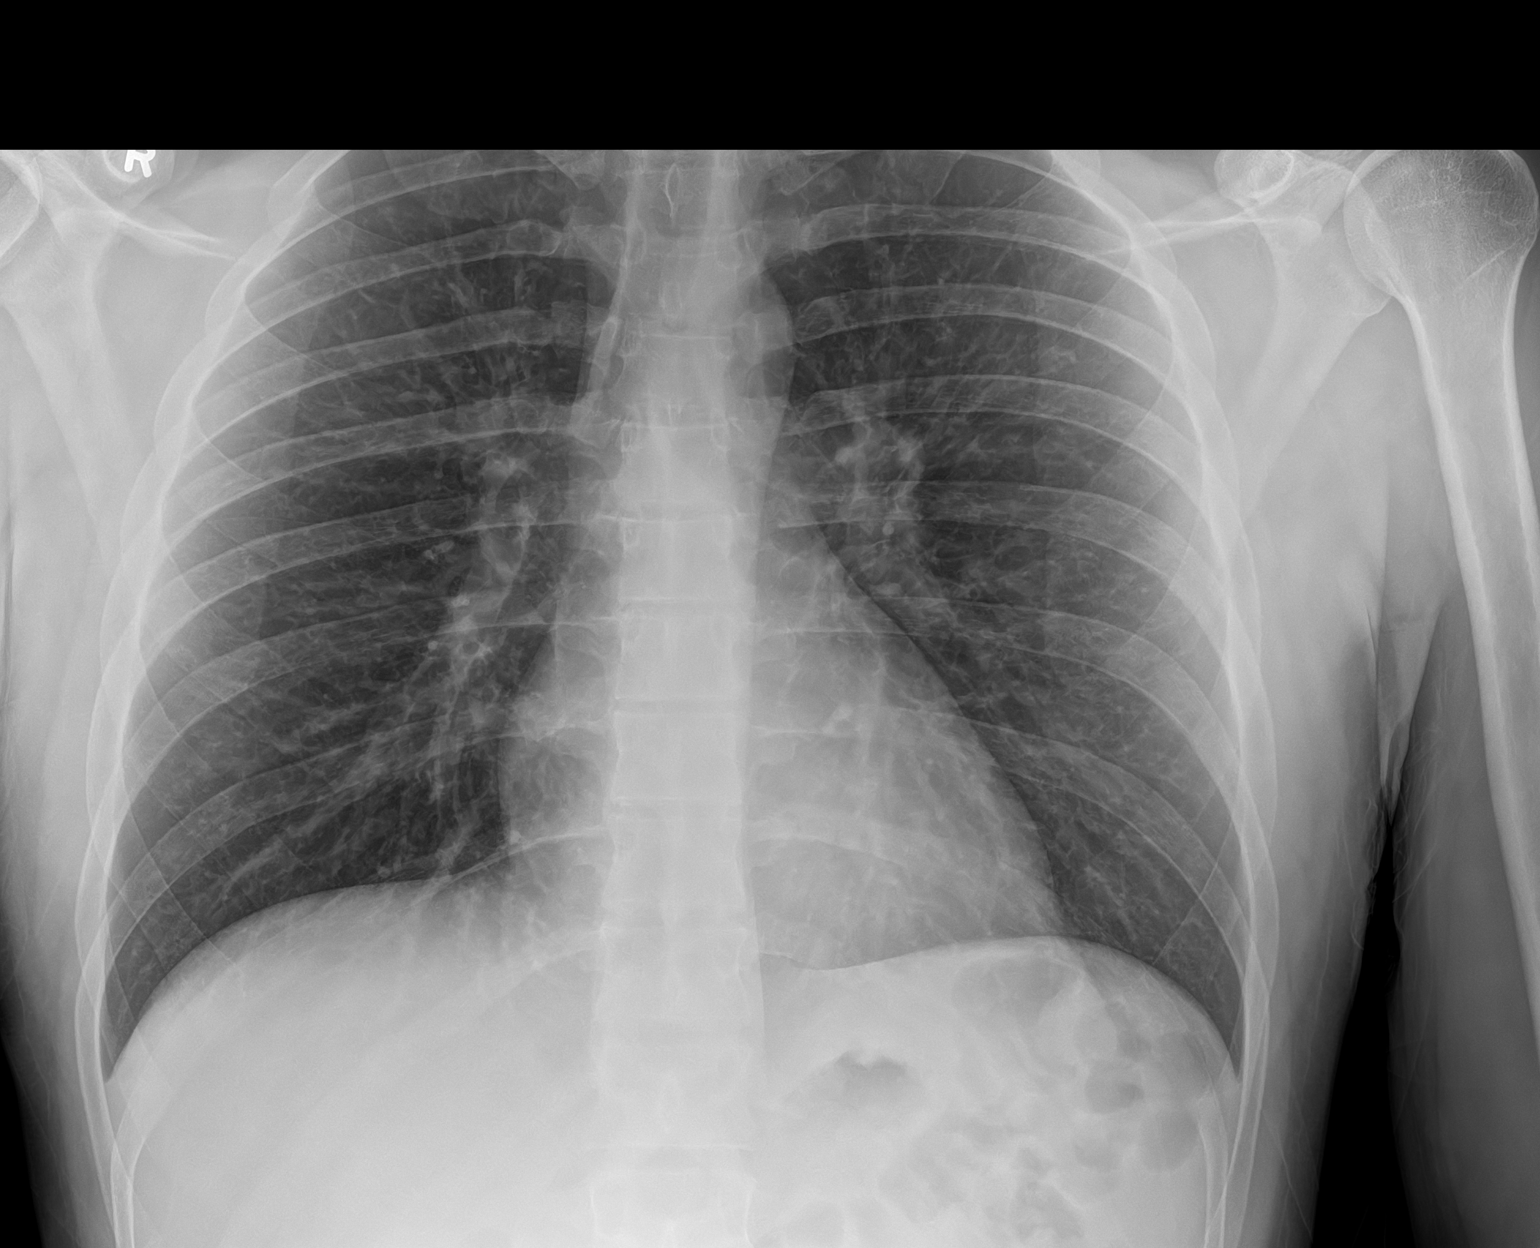

[2 of 2 positions shown; findings below may reference images not displayed]

FINDINGS: The heart size and mediastinal contours are within normal limits.
Both lungs are clear. The visualized skeletal structures are
unremarkable.
IMPRESSION: No active disease.
